# Patient Record
Sex: Male | Born: 1955
Health system: Southern US, Community
[De-identification: ages and names within clinical notes are randomized; demographics above are authoritative.]

## PROBLEM LIST (undated history)

## (undated) DIAGNOSIS — E669 Obesity, unspecified: Secondary | ICD-10-CM

## (undated) DIAGNOSIS — K769 Liver disease, unspecified: Secondary | ICD-10-CM

## (undated) DIAGNOSIS — K648 Other hemorrhoids: Secondary | ICD-10-CM

## (undated) DIAGNOSIS — R0600 Dyspnea, unspecified: Secondary | ICD-10-CM

## (undated) DIAGNOSIS — F1010 Alcohol abuse, uncomplicated: Secondary | ICD-10-CM

## (undated) DIAGNOSIS — K5790 Diverticulosis of intestine, part unspecified, without perforation or abscess without bleeding: Secondary | ICD-10-CM

## (undated) DIAGNOSIS — K759 Inflammatory liver disease, unspecified: Secondary | ICD-10-CM

## (undated) DIAGNOSIS — E7800 Pure hypercholesterolemia, unspecified: Secondary | ICD-10-CM

## (undated) DIAGNOSIS — M1990 Unspecified osteoarthritis, unspecified site: Secondary | ICD-10-CM

## (undated) DIAGNOSIS — C61 Malignant neoplasm of prostate: Secondary | ICD-10-CM

## (undated) DIAGNOSIS — D126 Benign neoplasm of colon, unspecified: Secondary | ICD-10-CM

## (undated) DIAGNOSIS — E119 Type 2 diabetes mellitus without complications: Secondary | ICD-10-CM

## (undated) DIAGNOSIS — D709 Neutropenia, unspecified: Secondary | ICD-10-CM

## (undated) DIAGNOSIS — D638 Anemia in other chronic diseases classified elsewhere: Secondary | ICD-10-CM

## (undated) DIAGNOSIS — Z8739 Personal history of other diseases of the musculoskeletal system and connective tissue: Secondary | ICD-10-CM

## (undated) DIAGNOSIS — F1911 Other psychoactive substance abuse, in remission: Secondary | ICD-10-CM

## (undated) DIAGNOSIS — Z960 Presence of urogenital implants: Secondary | ICD-10-CM

## (undated) DIAGNOSIS — S83281A Other tear of lateral meniscus, current injury, right knee, initial encounter: Secondary | ICD-10-CM

## (undated) DIAGNOSIS — I10 Essential (primary) hypertension: Secondary | ICD-10-CM

## (undated) HISTORY — DX: Personal history of other diseases of the musculoskeletal system and connective tissue: Z87.39

## (undated) HISTORY — DX: Pure hypercholesterolemia, unspecified: E78.00

## (undated) HISTORY — PX: MENISCUS REPAIR: SHX5179

## (undated) HISTORY — PX: LIVER BIOPSY: SHX301

## (undated) HISTORY — PX: KNEE ARTHROSCOPY: SHX127

---

## 2004-12-10 ENCOUNTER — Ambulatory Visit: Payer: Self-pay | Admitting: Family Medicine

## 2006-07-18 ENCOUNTER — Ambulatory Visit: Payer: Self-pay | Admitting: Family Medicine

## 2006-12-12 ENCOUNTER — Ambulatory Visit: Payer: Self-pay | Admitting: Gastroenterology

## 2007-01-25 ENCOUNTER — Ambulatory Visit: Payer: Self-pay | Admitting: Family Medicine

## 2008-05-06 ENCOUNTER — Ambulatory Visit: Payer: Self-pay | Admitting: Family Medicine

## 2009-09-05 ENCOUNTER — Ambulatory Visit: Payer: Self-pay | Admitting: Gastroenterology

## 2009-12-12 ENCOUNTER — Ambulatory Visit: Payer: Self-pay | Admitting: Gastroenterology

## 2014-04-09 ENCOUNTER — Ambulatory Visit: Payer: Self-pay | Admitting: Unknown Physician Specialty

## 2014-05-24 ENCOUNTER — Ambulatory Visit: Payer: Self-pay | Admitting: Unknown Physician Specialty

## 2015-01-02 ENCOUNTER — Encounter: Payer: Self-pay | Admitting: *Deleted

## 2015-01-03 ENCOUNTER — Encounter
Admission: RE | Disposition: A | Discharge status: Home / Self Care | Payer: Self-pay | Source: Ambulatory Visit | Attending: Gastroenterology

## 2015-01-03 ENCOUNTER — Ambulatory Visit
Admission: RE | Admit: 2015-01-03 | Discharge: 2015-01-03 | Disposition: A | Discharge status: Home / Self Care | Payer: PRIVATE HEALTH INSURANCE | Source: Ambulatory Visit | Attending: Gastroenterology | Admitting: Gastroenterology

## 2015-01-03 ENCOUNTER — Ambulatory Visit: Payer: PRIVATE HEALTH INSURANCE | Admitting: *Deleted

## 2015-01-03 ENCOUNTER — Encounter: Payer: Self-pay | Admitting: *Deleted

## 2015-01-03 DIAGNOSIS — K621 Rectal polyp: Secondary | ICD-10-CM | POA: Insufficient documentation

## 2015-01-03 DIAGNOSIS — Z86010 Personal history of colonic polyps: Secondary | ICD-10-CM | POA: Insufficient documentation

## 2015-01-03 DIAGNOSIS — D122 Benign neoplasm of ascending colon: Secondary | ICD-10-CM | POA: Insufficient documentation

## 2015-01-03 DIAGNOSIS — D120 Benign neoplasm of cecum: Secondary | ICD-10-CM | POA: Insufficient documentation

## 2015-01-03 DIAGNOSIS — Z8371 Family history of colonic polyps: Secondary | ICD-10-CM | POA: Diagnosis not present

## 2015-01-03 DIAGNOSIS — K759 Inflammatory liver disease, unspecified: Secondary | ICD-10-CM | POA: Diagnosis not present

## 2015-01-03 DIAGNOSIS — I10 Essential (primary) hypertension: Secondary | ICD-10-CM | POA: Diagnosis not present

## 2015-01-03 DIAGNOSIS — K648 Other hemorrhoids: Secondary | ICD-10-CM | POA: Diagnosis not present

## 2015-01-03 HISTORY — DX: Alcohol abuse, uncomplicated: F10.10

## 2015-01-03 HISTORY — DX: Other psychoactive substance abuse, in remission: F19.11

## 2015-01-03 HISTORY — PX: COLONOSCOPY WITH PROPOFOL: SHX5780

## 2015-01-03 HISTORY — DX: Inflammatory liver disease, unspecified: K75.9

## 2015-01-03 HISTORY — DX: Essential (primary) hypertension: I10

## 2015-01-03 HISTORY — DX: Liver disease, unspecified: K76.9

## 2015-01-03 HISTORY — DX: Diverticulosis of intestine, part unspecified, without perforation or abscess without bleeding: K57.90

## 2015-01-03 LAB — CBC
HCT: 45.4 % (ref 40.0–52.0)
Hemoglobin: 15.5 g/dL (ref 13.0–18.0)
MCH: 31.6 pg (ref 26.0–34.0)
MCHC: 34.2 g/dL (ref 32.0–36.0)
MCV: 92.4 fL (ref 80.0–100.0)
Platelets: 233 10*3/uL (ref 150–440)
RBC: 4.92 MIL/uL (ref 4.40–5.90)
RDW: 13.2 % (ref 11.5–14.5)
WBC: 3.4 10*3/uL — ABNORMAL LOW (ref 3.8–10.6)

## 2015-01-03 LAB — PROTIME-INR
INR: 1.08
Prothrombin Time: 14.2 seconds (ref 11.4–15.0)

## 2015-01-03 SURGERY — COLONOSCOPY WITH PROPOFOL
Anesthesia: General

## 2015-01-03 MED ORDER — SODIUM CHLORIDE 0.9 % IR SOLN
Status: DC | PRN
  Administered 2015-01-03: 8 mL

## 2015-01-03 MED ORDER — PROPOFOL INFUSION 10 MG/ML OPTIME
INTRAVENOUS | Status: DC | PRN
  Administered 2015-01-03: 70 mL via INTRAVENOUS

## 2015-01-03 MED ORDER — EPHEDRINE SULFATE 50 MG/ML IJ SOLN
INTRAMUSCULAR | Status: DC | PRN
  Administered 2015-01-03 (×2): 10 mg via INTRAVENOUS

## 2015-01-03 MED ORDER — SODIUM CHLORIDE 0.9 % IV SOLN
INTRAVENOUS | Status: DC
  Administered 2015-01-03 (×2): via INTRAVENOUS

## 2015-01-03 NOTE — Transfer of Care (Signed)
Immediate Anesthesia Transfer of Care Note  Patient: William Duffy  Procedure(s) Performed: Procedure(s): COLONOSCOPY WITH PROPOFOL (N/A)  Patient Location: PACU  Anesthesia Type:MAC  Level of Consciousness: awake  Airway & Oxygen Therapy: Patient Spontanous Breathing  Post-op Assessment: Report given to RN  Post vital signs: Reviewed  Last Vitals:  Filed Vitals:   01/03/15 1022  BP: 134/96  Pulse: 55  Temp: 35.6 C  Resp: 20    Complications: No apparent anesthesia complications

## 2015-01-03 NOTE — Anesthesia Postprocedure Evaluation (Signed)
  Anesthesia Post-op Note  Patient: William Duffy  Procedure(s) Performed: Procedure(s): COLONOSCOPY WITH PROPOFOL (N/A)  Anesthesia type:General  Patient location: PACU  Post pain: Pain level controlled  Post assessment: Post-op Vital signs reviewed, Patient's Cardiovascular Status Stable, Respiratory Function Stable, Patent Airway and No signs of Nausea or vomiting  Post vital signs: Reviewed and stable  Last Vitals:  Filed Vitals:   01/03/15 1246  BP: 138/82  Pulse: 85  Temp:   Resp: 12    Level of consciousness: awake, alert  and patient cooperative  Complications: No apparent anesthesia complications

## 2015-01-03 NOTE — Op Note (Signed)
[facility] Gastroenterology Patient Name: William Duffy Procedure Date: 01/03/2015 11:17 AM MRN: [id number] Account #: [id number] Date of Birth: 06-Apr-1956 Admit Type: Outpatient Age: 59 Room: [facility] ENDO ROOM 1 Gender: Male Note Status: Finalized Procedure:         Colonoscopy Indications:       Personal history of colonic polyps Providers:         Lollie Sails, MD Referring MD:      Dion Body (Referring MD) Medicines:         Monitored Anesthesia Care Complications:     No immediate complications. Procedure:         Pre-Anesthesia Assessment:                    - ASA Grade Assessment: II - A patient with mild systemic                     disease.                    After obtaining informed consent, the colonoscope was                     passed under direct vision. Throughout the procedure, the                     patient's blood pressure, pulse, and oxygen saturations                     were monitored continuously. The Olympus PCF-H180AL                     colonoscope ( S#: [id number] ) was introduced through the                     anus and advanced to the the cecum, identified by                     appendiceal orifice and ileocecal valve. The quality of                     the bowel preparation was good. Findings:      A 2 mm polyp was found in the cecum. The polyp was flat. The polyp was       removed with a cold biopsy forceps. Resection and retrieval were       complete.      A 35 mm polyp was found in the proximal ascending colon. The polyp was       multi-lobulated and sessile. The polyp was removed with a cold biopsy       forceps, The polyp was removed with a hot snare, The polyp was removed       with a saline injection-lift technique using a hot snare and The polyp       was removed with a piecemeal technique using a hot snare. Resection and       retrieval were complete. Two hemostatic clips were successfully placed.      The  digital rectal exam was normal.      A 3 mm polyp was found in the rectum. The polyp was sessile. The polyp       was removed with a hot snare. The polyp was removed with a lift and cut       technique using a hot snare. Resection  and retrieval were complete. Impression:        - One 2 mm polyp in the cecum. Resected and retrieved.                    - One 35 mm polyp in the proximal ascending colon.                     Resected and retrieved. Clips were placed.                    - One 3 mm polyp in the rectum. Resected and retrieved. Recommendation:    - Await pathology results.                    - Telephone GI clinic for pathology results in 1 week. Procedure Code(s): --- Professional ---                    [phone], 65, Colonoscopy, flexible; with endoscopic mucosal                     resection                    [id number], Colonoscopy, flexible; with removal of tumor(s),                     polyp(s), or other lesion(s) by snare technique                    45380, 44, Colonoscopy, flexible; with biopsy, single or                     multiple Diagnosis Code(s): --- Professional ---                    211.3, Benign neoplasm of colon                    569.0, Anal and rectal polyp                    V12.72, Personal history of colonic polyps CPT copyright 2014 American Medical Association. All rights reserved. The codes documented in this report are preliminary and upon coder review may  be revised to meet current compliance requirements. Lollie Sails, MD 01/03/2015 12:43:33 PM This report has been signed electronically. Number of Addenda: 0 Note Initiated On: 01/03/2015 11:17 AM Scope Withdrawal Time: 0 hours 36 minutes 17 seconds  Total Procedure Duration: 0 hours 44 minutes 51 seconds       [facility]

## 2015-01-03 NOTE — H&P (Signed)
Outpatient short stay form Pre-procedure 01/03/2015 11:42 AM Lollie Sails MD  Primary Physician: Dr Barbaraann Cao  Reason for visit:  Colonoscopy  History of present illness:  Patient is a 59 year old male presenting today for a colonoscopy. He has a personal history of adenomatous colon polyps. A family history of colon polyps in father. She does not take any anticoagulates or aspirin products.    Current facility-administered medications:  .  0.9 %  sodium chloride infusion, , Intravenous, Continuous, Lollie Sails, MD, Last Rate: 20 mL/hr at 01/03/15 1046  No prescriptions prior to admission     Allergies  Allergen Reactions  . Shrimp [Shellfish Allergy] Swelling     Past Medical History  Diagnosis Date  . Hepatitis   . H/O drug abuse   . Alcohol abuse   . Hypertension   . Liver disease   . Diverticulosis     Review of systems:      Physical Exam    Heart and lungs: Regular rate and rhythm without rub or gallop lungs are bilaterally clear    HEENT: Normocephalic atraumatic eyes are anicteric    Other:     Pertinant exam for procedure: Soft nontender nondistended bowel sounds positive normoactive    Planned proceedures: Anoscopy and indicated procedures I have discussed the risks benefits and complications of procedures to include not limited to bleeding, infection, perforation and the risk of sedation and the patient wishes to proceed.    Lollie Sails, MD Gastroenterology 01/03/2015  11:42 AM

## 2015-01-03 NOTE — Anesthesia Preprocedure Evaluation (Addendum)
Anesthesia Evaluation  Patient identified by MRN, date of birth, ID band Patient awake    Reviewed: Allergy & Precautions, NPO status , Patient's Chart, lab work & pertinent test results  Airway Mallampati: II       Dental  (+) Partial Upper   Pulmonary neg pulmonary ROS,  breath sounds clear to auscultation  Pulmonary exam normal       Cardiovascular hypertension, Normal cardiovascular exam    Neuro/Psych negative neurological ROS  negative psych ROS   GI/Hepatic (+) Hepatitis -, UnspecifiedLiver DZ diverticulosis   Endo/Other  negative endocrine ROS  Renal/GU negative Renal ROS  negative genitourinary   Musculoskeletal negative musculoskeletal ROS (+)   Abdominal Normal abdominal exam  (+)   Peds negative pediatric ROS (+)  Hematology negative hematology ROS (+)   Anesthesia Other Findings   Reproductive/Obstetrics                            Anesthesia Physical Anesthesia Plan  ASA: III  Anesthesia Plan: General   Post-op Pain Management:    Induction: Intravenous  Airway Management Planned: Nasal Cannula  Additional Equipment:   Intra-op Plan:   Post-operative Plan:   Informed Consent: I have reviewed the patients History and Physical, chart, labs and discussed the procedure including the risks, benefits and alternatives for the proposed anesthesia with the patient or authorized representative who has indicated his/her understanding and acceptance.   Dental advisory given  Plan Discussed with: CRNA and Surgeon  Anesthesia Plan Comments:         Anesthesia Quick Evaluation

## 2015-01-04 NOTE — Progress Notes (Signed)
Non-Identifying Voicemail.  No message left.

## 2015-01-07 ENCOUNTER — Encounter: Payer: Self-pay | Admitting: Gastroenterology

## 2015-01-07 LAB — SURGICAL PATHOLOGY

## 2015-05-06 ENCOUNTER — Ambulatory Visit
Admission: RE | Admit: 2015-05-06 | Discharge: 2015-05-06 | Disposition: A | Discharge status: Home / Self Care | Payer: PRIVATE HEALTH INSURANCE | Source: Ambulatory Visit | Attending: Gastroenterology | Admitting: Gastroenterology

## 2015-05-06 ENCOUNTER — Ambulatory Visit: Payer: PRIVATE HEALTH INSURANCE | Admitting: Anesthesiology

## 2015-05-06 ENCOUNTER — Encounter: Payer: Self-pay | Admitting: Anesthesiology

## 2015-05-06 ENCOUNTER — Encounter
Admission: RE | Disposition: A | Discharge status: Home / Self Care | Payer: Self-pay | Source: Ambulatory Visit | Attending: Gastroenterology

## 2015-05-06 DIAGNOSIS — M1990 Unspecified osteoarthritis, unspecified site: Secondary | ICD-10-CM | POA: Insufficient documentation

## 2015-05-06 DIAGNOSIS — K621 Rectal polyp: Secondary | ICD-10-CM | POA: Insufficient documentation

## 2015-05-06 DIAGNOSIS — K769 Liver disease, unspecified: Secondary | ICD-10-CM | POA: Diagnosis not present

## 2015-05-06 DIAGNOSIS — D120 Benign neoplasm of cecum: Secondary | ICD-10-CM | POA: Insufficient documentation

## 2015-05-06 DIAGNOSIS — Z91013 Allergy to seafood: Secondary | ICD-10-CM | POA: Diagnosis not present

## 2015-05-06 DIAGNOSIS — I10 Essential (primary) hypertension: Secondary | ICD-10-CM | POA: Diagnosis not present

## 2015-05-06 DIAGNOSIS — F1010 Alcohol abuse, uncomplicated: Secondary | ICD-10-CM | POA: Insufficient documentation

## 2015-05-06 DIAGNOSIS — Z86010 Personal history of colonic polyps: Secondary | ICD-10-CM | POA: Diagnosis present

## 2015-05-06 HISTORY — PX: COLONOSCOPY WITH PROPOFOL: SHX5780

## 2015-05-06 LAB — PROTIME-INR
INR: 0.98
PROTHROMBIN TIME: 13.2 s (ref 11.4–15.0)

## 2015-05-06 SURGERY — COLONOSCOPY WITH PROPOFOL
Anesthesia: General

## 2015-05-06 MED ORDER — FENTANYL CITRATE (PF) 100 MCG/2ML IJ SOLN
INTRAMUSCULAR | Status: DC | PRN
  Administered 2015-05-06: 50 ug via INTRAVENOUS

## 2015-05-06 MED ORDER — PROPOFOL 500 MG/50ML IV EMUL
INTRAVENOUS | Status: DC | PRN
  Administered 2015-05-06: 120 ug/kg/min via INTRAVENOUS

## 2015-05-06 MED ORDER — EPHEDRINE SULFATE 50 MG/ML IJ SOLN
INTRAMUSCULAR | Status: DC | PRN
  Administered 2015-05-06: 10 mg via INTRAVENOUS
  Administered 2015-05-06: 5 mg via INTRAVENOUS
  Administered 2015-05-06: 50 mg via INTRAVENOUS

## 2015-05-06 MED ORDER — SODIUM CHLORIDE 0.9 % IV SOLN
INTRAVENOUS | Status: DC
  Administered 2015-05-06: 08:00:00 via INTRAVENOUS

## 2015-05-06 MED ORDER — MIDAZOLAM HCL 2 MG/2ML IJ SOLN
INTRAMUSCULAR | Status: DC | PRN
  Administered 2015-05-06: 1 mg via INTRAVENOUS

## 2015-05-06 MED ORDER — PHENYLEPHRINE HCL 10 MG/ML IJ SOLN
INTRAMUSCULAR | Status: DC | PRN
  Administered 2015-05-06: 50 ug via INTRAVENOUS

## 2015-05-06 MED ORDER — SODIUM CHLORIDE 0.9 % IV SOLN: INTRAVENOUS | Status: DC

## 2015-05-06 NOTE — Op Note (Addendum)
[facility] Gastroenterology Patient Name: William Duffy Procedure Date: 05/06/2015 8:43 AM MRN: [id number] Account #: [id number] Date of Birth: 1956/02/23 Admit Type: Outpatient Age: 59 Room: [facility] ENDO ROOM 3 Gender: Male Note Status: Finalized Procedure:         Colonoscopy Indications:       Personal history of colonic polyps, , krecheck o21f site of                     previous polypectomy with high grade dysplasia Providers:         Lollie Sails, MD Referring MD:      Dion Body (Referring MD) Medicines:         Monitored Anesthesia Care Complications:     No immediate complications. Procedure:         Pre-Anesthesia Assessment:                    - ASA Grade Assessment: III - A patient with severe                     systemic disease.                    After obtaining informed consent, the colonoscope was                     passed under direct vision. Throughout the procedure, the                     patient's blood pressure, pulse, and oxygen saturations                     were monitored continuously. The Colonoscope was                     introduced through the anus and advanced to the the cecum,                     identified by appendiceal orifice and ileocecal valve. The                     colonoscopy was performed without difficulty. The patient                     tolerated the procedure well. The quality of the bowel                     preparation was good. Findings:      A less than 1 mm polyp was found in the cecum. The polyp was sessile.       The polyp was removed with a cold biopsy forceps. Resection and       retrieval were complete.      Post-polypectomy scar with some prominant mucosa folds consistant with       previous removal as noted in the proximal ascending colon. Biopsies were       taken with a cold forceps for histology. Grossly normal in appearance.      A 1 mm polyp was found in the rectum. The polyp was  sessile. The polyp       was removed with a cold biopsy forceps. Resection and retrieval were       complete.      The retroflexed view of the distal rectum and anal verge was normal and  showed no anal or rectal abnormalities. Impression:        - One less than 1 mm polyp in the cecum. Resected and                     retrieved.                    - Post-polypectomy scar in the proximal ascending colon.                     Biopsied.                    - One 1 mm polyp in the rectum. Resected and retrieved.                    - The distal rectum and anal verge are normal on                     retroflexion view. Recommendation:    - Discharge patient to home.                    - Telephone GI clinic for pathology results in 1 week. Procedure Code(s): --- Professional ---                    [phone], Colonoscopy, flexible; with biopsy, single or                     multiple Diagnosis Code(s): --- Professional ---                    211.3, Benign neoplasm of colon                    569.0, Anal and rectal polyp                    V12.72, Personal history of colonic polyps                    V45.89, Other postprocedural status CPT copyright 2014 American Medical Association. All rights reserved. The codes documented in this report are preliminary and upon coder review may  be revised to meet current compliance requirements. Lollie Sails, MD 05/06/2015 9:18:32 AM This report has been signed electronically. Number of Addenda: 0 Note Initiated On: 05/06/2015 8:43 AM Scope Withdrawal Time: 0 hours 16 minutes 7 seconds  Total Procedure Duration: 0 hours 20 minutes 14 seconds       [facility]

## 2015-05-06 NOTE — Transfer of Care (Signed)
Immediate Anesthesia Transfer of Care Note  Patient: William Duffy  Procedure(s) Performed: Procedure(s): COLONOSCOPY WITH PROPOFOL (N/A)  Patient Location: PACU  Anesthesia Type:General  Level of Consciousness: awake and sedated  Airway & Oxygen Therapy: Patient Spontanous Breathing and Patient connected to nasal cannula oxygen  Post-op Assessment: Report given to RN and Post -op Vital signs reviewed and stable  Post vital signs: Reviewed and stable  Last Vitals:  Filed Vitals:   05/06/15 0730  BP: 137/91  Pulse: 66  Temp: 36.3 C  Resp: 18    Complications: No apparent anesthesia complications

## 2015-05-06 NOTE — H&P (Signed)
Outpatient short stay form Pre-procedure 05/06/2015 8:08 AM William Sails MD  Primary Physician: Dr Dion Body  Reason for visit:  Colonoscopy  History of present illness:  Asian is a 59 year old male presenting for a recheck of a polypectomy site from colonoscopy done 01/03/2015. There is a 35 mm adenoma in the proximal ascending colon that was removed showing fragments of high-grade dysplasia. Her going to recheck the site sure that the removal was complete.  He tolerated his prep well. He takes no aspirin products. PTT and platelet count is been checked this morning before the procedure in reguards to some of his previous medical history.    Current facility-administered medications:  .  0.9 %  sodium chloride infusion, , Intravenous, Continuous, William Sails, MD .  0.9 %  sodium chloride infusion, , Intravenous, Continuous, William Sails, MD .  0.9 %  sodium chloride infusion, , Intravenous, Continuous, William Sails, MD, Last Rate: 20 mL/hr at 05/06/15 6759  Prescriptions prior to admission  Medication Sig Dispense Refill Last Dose  . acetaminophen (TYLENOL ARTHRITIS PAIN) 650 MG CR tablet Place 650 mg rectally every 8 (eight) hours as needed for pain.   05/05/2015     Allergies  Allergen Reactions  . Shrimp [Shellfish Allergy] Swelling     Past Medical History  Diagnosis Date  . Hepatitis   . H/O drug abuse   . Alcohol abuse   . Hypertension   . Liver disease   . Diverticulosis     Review of systems:      Physical Exam    Heart and lungs: Regular rate and rhythm without rub or gallop lungs are bilaterally clear    HEENT: Normocephalic atraumatic eyes are anicteric    Other:     Pertinant exam for procedure: Soft nontender nondistended bowel sounds positive normoactive    Planned proceedures: Colonoscopy I have discussed the risks benefits and complications of procedures to include not limited to bleeding, infection, perforation and  the risk of sedation and the patient wishes to proceed. and indicated procedures.    William Sails, MD Gastroenterology 05/06/2015  8:08 AM

## 2015-05-06 NOTE — Anesthesia Preprocedure Evaluation (Signed)
Anesthesia Evaluation  Patient identified by MRN, date of birth, ID band Patient awake    Reviewed: Allergy & Precautions, H&P , NPO status , Patient's Chart, lab work & pertinent test results  History of Anesthesia Complications Negative for: history of anesthetic complications  Airway Mallampati: II  TM Distance: >3 FB Neck ROM: full    Dental  (+) Partial Upper, Poor Dentition, Chipped, Missing   Pulmonary neg pulmonary ROS,    Pulmonary exam normal breath sounds clear to auscultation       Cardiovascular Exercise Tolerance: Good hypertension, (-) angina(-) DOE Normal cardiovascular exam Rhythm:regular Rate:Normal     Neuro/Psych negative neurological ROS  negative psych ROS   GI/Hepatic negative GI ROS, Neg liver ROS, neg GERD  ,(+) Hepatitis -, UnspecifiedLiver DZ diverticulosis   Endo/Other  negative endocrine ROS  Renal/GU negative Renal ROS  negative genitourinary   Musculoskeletal negative musculoskeletal ROS (+)   Abdominal Normal abdominal exam  (+)   Peds negative pediatric ROS (+)  Hematology negative hematology ROS (+)   Anesthesia Other Findings Past Medical History:   Hepatitis                                                    H/O drug abuse                                               Alcohol abuse                                                Hypertension                                                 Liver disease                                                Diverticulosis                                              Past Surgical History:   KNEE ARTHROSCOPY                                Right              COLONOSCOPY WITH PROPOFOL                       N/A 01/03/2015       Comment:Procedure: COLONOSCOPY WITH PROPOFOL;  Surgeon:              Lollie Sails, MD;  Location: [facility]  ENDOSCOPY;  Service: Endoscopy;  Laterality:               N/A;     Reproductive/Obstetrics negative OB ROS                             Anesthesia Physical Anesthesia Plan  ASA: III  Anesthesia Plan: General   Post-op Pain Management:    Induction:   Airway Management Planned:   Additional Equipment:   Intra-op Plan:   Post-operative Plan:   Informed Consent: I have reviewed the patients History and Physical, chart, labs and discussed the procedure including the risks, benefits and alternatives for the proposed anesthesia with the patient or authorized representative who has indicated his/her understanding and acceptance.   Dental Advisory Given  Plan Discussed with: Anesthesiologist, CRNA and Surgeon  Anesthesia Plan Comments:         Anesthesia Quick Evaluation

## 2015-05-06 NOTE — Anesthesia Postprocedure Evaluation (Signed)
  Anesthesia Post-op Note  Patient: William Duffy  Procedure(s) Performed: Procedure(s): COLONOSCOPY WITH PROPOFOL (N/A)  Anesthesia type:General  Patient location: PACU  Post pain: Pain level controlled  Post assessment: Post-op Vital signs reviewed, Patient's Cardiovascular Status Stable, Respiratory Function Stable, Patent Airway and No signs of Nausea or vomiting  Post vital signs: Reviewed and stable  Last Vitals:  Filed Vitals:   05/06/15 0935  BP: 131/99  Pulse:   Temp:   Resp:     Level of consciousness: awake, alert  and patient cooperative  Complications: No apparent anesthesia complications

## 2015-05-06 NOTE — Anesthesia Procedure Notes (Signed)
Performed by: COOK-MARTIN, Daziya Redmond Pre-anesthesia Checklist: Patient identified, Emergency Drugs available, Suction available, Patient being monitored and Timeout performed Patient Re-evaluated:Patient Re-evaluated prior to inductionOxygen Delivery Method: Nasal cannula Preoxygenation: Pre-oxygenation with 100% oxygen Intubation Type: IV induction Airway Equipment and Method: Bite block Placement Confirmation: CO2 detector and positive ETCO2     

## 2015-05-07 ENCOUNTER — Encounter: Payer: Self-pay | Admitting: Gastroenterology

## 2015-05-07 LAB — SURGICAL PATHOLOGY

## 2015-05-19 ENCOUNTER — Other Ambulatory Visit: Payer: Self-pay | Admitting: Gastroenterology

## 2015-05-19 DIAGNOSIS — B182 Chronic viral hepatitis C: Secondary | ICD-10-CM

## 2015-05-27 ENCOUNTER — Ambulatory Visit
Admission: RE | Admit: 2015-05-27 | Discharge: 2015-05-27 | Disposition: A | Discharge status: Home / Self Care | Payer: PRIVATE HEALTH INSURANCE | Source: Ambulatory Visit | Attending: Gastroenterology | Admitting: Gastroenterology

## 2015-05-27 DIAGNOSIS — K760 Fatty (change of) liver, not elsewhere classified: Secondary | ICD-10-CM | POA: Diagnosis not present

## 2015-05-27 DIAGNOSIS — B182 Chronic viral hepatitis C: Secondary | ICD-10-CM | POA: Insufficient documentation

## 2015-09-10 DIAGNOSIS — Z8679 Personal history of other diseases of the circulatory system: Secondary | ICD-10-CM | POA: Insufficient documentation

## 2015-09-15 DIAGNOSIS — R7303 Prediabetes: Secondary | ICD-10-CM | POA: Insufficient documentation

## 2015-10-29 ENCOUNTER — Other Ambulatory Visit: Payer: Self-pay | Admitting: Gastroenterology

## 2015-10-29 DIAGNOSIS — B182 Chronic viral hepatitis C: Secondary | ICD-10-CM

## 2015-11-04 ENCOUNTER — Ambulatory Visit
Admission: RE | Admit: 2015-11-04 | Discharge: 2015-11-04 | Disposition: A | Discharge status: Home / Self Care | Payer: PRIVATE HEALTH INSURANCE | Source: Ambulatory Visit | Attending: Gastroenterology | Admitting: Gastroenterology

## 2015-11-04 DIAGNOSIS — K760 Fatty (change of) liver, not elsewhere classified: Secondary | ICD-10-CM | POA: Diagnosis not present

## 2015-11-04 DIAGNOSIS — B182 Chronic viral hepatitis C: Secondary | ICD-10-CM | POA: Insufficient documentation

## 2015-12-07 ENCOUNTER — Emergency Department
Admission: EM | Admit: 2015-12-07 | Discharge: 2015-12-07 | Disposition: A | Discharge status: Home / Self Care | Payer: PRIVATE HEALTH INSURANCE | Attending: Emergency Medicine | Admitting: Emergency Medicine

## 2015-12-07 ENCOUNTER — Emergency Department: Payer: PRIVATE HEALTH INSURANCE

## 2015-12-07 DIAGNOSIS — M109 Gout, unspecified: Secondary | ICD-10-CM | POA: Diagnosis not present

## 2015-12-07 DIAGNOSIS — M25562 Pain in left knee: Secondary | ICD-10-CM | POA: Diagnosis present

## 2015-12-07 DIAGNOSIS — I10 Essential (primary) hypertension: Secondary | ICD-10-CM | POA: Diagnosis not present

## 2015-12-07 LAB — CBC WITH DIFFERENTIAL/PLATELET
BASOS ABS: 0.1 10*3/uL (ref 0–0.1)
EOS ABS: 0 10*3/uL (ref 0–0.7)
Eosinophils Relative: 0 %
HEMATOCRIT: 42.4 % (ref 40.0–52.0)
HEMOGLOBIN: 14.5 g/dL (ref 13.0–18.0)
Lymphocytes Relative: 7 %
Lymphs Abs: 0.5 10*3/uL — ABNORMAL LOW (ref 1.0–3.6)
MCH: 31.2 pg (ref 26.0–34.0)
MCHC: 34.2 g/dL (ref 32.0–36.0)
MCV: 91.2 fL (ref 80.0–100.0)
Monocytes Absolute: 0.6 10*3/uL (ref 0.2–1.0)
NEUTROS ABS: 6.6 10*3/uL — AB (ref 1.4–6.5)
Platelets: 258 10*3/uL (ref 150–440)
RBC: 4.65 MIL/uL (ref 4.40–5.90)
RDW: 13 % (ref 11.5–14.5)
WBC: 7.9 10*3/uL (ref 3.8–10.6)

## 2015-12-07 LAB — URIC ACID: URIC ACID, SERUM: 8.4 mg/dL — AB (ref 4.4–7.6)

## 2015-12-07 MED ORDER — COLCHICINE 0.6 MG PO TABS: 0.6000 mg | ORAL_TABLET | Freq: Every day | ORAL | Status: DC

## 2015-12-07 MED ORDER — INDOMETHACIN 50 MG PO CAPS: 50.0000 mg | ORAL_CAPSULE | Freq: Three times a day (TID) | ORAL | Status: DC | PRN

## 2015-12-07 MED ORDER — IBUPROFEN 600 MG PO TABS
600.0000 mg | ORAL_TABLET | Freq: Once | ORAL | Status: DC
  Filled 2015-12-07: qty 1

## 2015-12-07 NOTE — ED Provider Notes (Signed)
[facility] Emergency Department Provider Note  ____________________________________________  Time seen: Approximately 10:29 AM  I have reviewed the triage vital signs and the nursing notes.   HISTORY  Chief Complaint Knee Pain    HPI William Duffy is a 60 y.o. male , NAD, presents to the emergency department with 3 day history of left knee pain and swelling. Has noted some redness to the knee over the last couple of days. Pain increases with ambulating and bending of the knee. Does not noted any locking or popping about the knee nor any weakness about the knee. Denies any injury, trauma, falls to cause the pain. Has not had any pain or swelling about his knee or other joint in the past. Has not noted any skin sores or open wounds about the area. Denies any numbness, weakness, tingling. Has not had any lower back pain. Denies any fevers, chills, body aches.   Past Medical History  Diagnosis Date  . Hepatitis   . H/O drug abuse   . Alcohol abuse   . Hypertension   . Liver disease   . Diverticulosis     There are no active problems to display for this patient.   Past Surgical History  Procedure Laterality Date  . Knee arthroscopy Right   . Colonoscopy with propofol N/A 01/03/2015    Procedure: COLONOSCOPY WITH PROPOFOL;  Surgeon: Lollie Sails, MD;  Location: [facility] ENDOSCOPY;  Service: Endoscopy;  Laterality: N/A;  . Colonoscopy with propofol N/A 05/06/2015    Procedure: COLONOSCOPY WITH PROPOFOL;  Surgeon: Lollie Sails, MD;  Location: [facility] ENDOSCOPY;  Service: Endoscopy;  Laterality: N/A;    Current Outpatient Rx  Name  Route  Sig  Dispense  Refill  . acetaminophen (TYLENOL ARTHRITIS PAIN) 650 MG CR tablet   Rectal   Place 650 mg rectally every 8 (eight) hours as needed for pain.         Marland Kitchen colchicine 0.6 MG tablet   Oral   Take 1 tablet (0.6 mg total) by mouth daily. Take 2 tablets at onset of pain, then may take 1 more 1 hour later if  pain continues.   6 tablet   0   . indomethacin (INDOCIN) 50 MG capsule   Oral   Take 1 capsule (50 mg total) by mouth 3 (three) times daily with meals as needed.   21 capsule   0     Allergies Shrimp  No family history on file.  Social History Social History  Substance Use Topics  . Smoking status: Never Smoker   . Smokeless tobacco: Never Used  . Alcohol Use: No     Review of Systems  Constitutional: No fever/chills, fatigue Cardiovascular: No chest pain. Respiratory: No cough. No shortness of breath. No wheezing.  Gastrointestinal: No abdominal pain.  No nausea, vomiting. Musculoskeletal: Positive left knee pain. Negative for back pain.  Skin: Positive redness, swelling left knee. Negative for rash, skin sores, open wounds. Neurological: Negative for headaches, focal weakness or numbness. No tingling.  10-point ROS otherwise negative.  ____________________________________________   PHYSICAL EXAM:  VITAL SIGNS: ED Triage Vitals  Enc Vitals Group     BP 12/07/15 1009 138/87 mmHg     Pulse Rate 12/07/15 1009 98     Resp 12/07/15 1009 18     Temp 12/07/15 1009 98.1 F (36.7 C)     Temp Source 12/07/15 1009 Oral     SpO2 12/07/15 1009 95 %     Weight  12/07/15 1009 228 lb (103.42 kg)     Height 12/07/15 1009 5\' 6"  (1.676 m)     Head Cir --      Peak Flow --      Pain Score 12/07/15 1011 10     Pain Loc --      Pain Edu? --      Excl. in [place]? --      Constitutional: Alert and oriented. Well appearing and in no acute distress. Eyes: Conjunctivae are normal.  Head: Atraumatic. Cardiovascular: Good peripheral circulation with 2+ pulses noted in the left lower extremity. Capillary refill is brisk in the left lower extremity. Respiratory: Normal respiratory effort without tachypnea or retractions.  Musculoskeletal: Positive patellofemoral grind pain. Tenderness to palpation over the anterior knee. No pain to palpation of the posterior knee, left calf for left  lower extremity. No lower extremity edema.  No joint effusions. Neurologic:  Normal speech and language. No gross focal neurologic deficits are appreciated. Sensation to light touch is grossly intact about the left lower extremity. Skin:  Skin about the anterior left knee is mildly erythematous, warm to palpation and mildly swollen. Skin is warm, dry and intact. No rash, open wounds, skin sores noted. Psychiatric: Mood and affect are normal. Speech and behavior are normal. Patient exhibits appropriate insight and judgement.   ____________________________________________   LABS (all labs ordered are listed, but only abnormal results are displayed)  Labs Reviewed  CBC WITH DIFFERENTIAL/PLATELET - Abnormal; Notable for the following:    Neutro Abs 6.6 (*)    Lymphs Abs 0.5 (*)    All other components within normal limits  URIC ACID - Abnormal; Notable for the following:    Uric Acid, Serum 8.4 (*)    All other components within normal limits   ____________________________________________  EKG  None ____________________________________________  RADIOLOGY I have personally viewed and evaluated these images (plain radiographs) as part of my medical decision making, as well as reviewing the written report by the radiologist.  Dg Knee Complete 4 Views Left  12/07/2015  CLINICAL DATA:  60 year old male with left knee pain and swelling for 3 days. No known injury. Initial encounter. EXAM: LEFT KNEE - COMPLETE 4+ VIEW COMPARISON:  None. FINDINGS: There is no evidence fracture, subluxation or dislocation. Mild joint space narrowing and osteophytosis in the patellofemoral compartment noted. There is a suggestion of lateral compartment chondrocalcinosis. No joint effusion or focal bony lesions are noted. IMPRESSION: No evidence of acute abnormality. Mild degenerative changes in the patellofemoral compartment. Possible lateral compartment chondrocalcinosis/ CPPD. Electronically Signed   By: Margarette Canada M.D.   On: 12/07/2015 10:50    ____________________________________________    PROCEDURES  Procedure(s) performed: None    Medications  ibuprofen (ADVIL,MOTRIN) tablet 600 mg (not administered)     ____________________________________________   INITIAL IMPRESSION / ASSESSMENT AND PLAN / ED COURSE  Pertinent labs & imaging results that were available during my care of the patient were reviewed by me and considered in my medical decision making (see chart for details).  Patient's diagnosis is consistent with acute gout of the left knee. Patient will be discharged home with prescriptions for colchicine and indomethacin to take as directed. Patient is to rest the left knee over the next 1-2 days to recover. Patient is to follow up with his primary care provider if symptoms persist past this treatment course as well as to have a repeat uric acid in 2 weeks to assess for chronic gout. Patient is given  ED precautions to return to the ED for any worsening or new symptoms.    ____________________________________________  FINAL CLINICAL IMPRESSION(S) / ED DIAGNOSES  Final diagnoses:  Acute gout of left knee, unspecified cause      NEW MEDICATIONS STARTED DURING THIS VISIT:  New Prescriptions   COLCHICINE 0.6 MG TABLET    Take 1 tablet (0.6 mg total) by mouth daily. Take 2 tablets at onset of pain, then may take 1 more 1 hour later if pain continues.   INDOMETHACIN (INDOCIN) 50 MG CAPSULE    Take 1 capsule (50 mg total) by mouth 3 (three) times daily with meals as needed.         Braxton Feathers, PA-C 12/07/15 [place], MD 12/07/15 [phone]

## 2015-12-07 NOTE — ED Notes (Signed)
See triage note  States left knee pain w/o known injury  States pain increases with ambulation   Some swelling noted on arrival

## 2015-12-07 NOTE — ED Notes (Signed)
Pt reports to left knee that started on Thursday. Denies injury. Walking made pain worse. Minor swelling per pt no redness

## 2015-12-07 NOTE — Discharge Instructions (Signed)

## 2015-12-17 ENCOUNTER — Other Ambulatory Visit: Payer: Self-pay | Admitting: Gastroenterology

## 2015-12-17 DIAGNOSIS — B182 Chronic viral hepatitis C: Secondary | ICD-10-CM

## 2016-06-16 ENCOUNTER — Other Ambulatory Visit: Payer: Self-pay | Admitting: Gastroenterology

## 2016-06-16 DIAGNOSIS — B182 Chronic viral hepatitis C: Secondary | ICD-10-CM

## 2016-06-17 ENCOUNTER — Ambulatory Visit
Admission: RE | Admit: 2016-06-17 | Discharge: 2016-06-17 | Disposition: A | Discharge status: Home / Self Care | Payer: 59 | Source: Ambulatory Visit | Attending: Gastroenterology | Admitting: Gastroenterology

## 2016-06-17 DIAGNOSIS — B182 Chronic viral hepatitis C: Secondary | ICD-10-CM | POA: Diagnosis not present

## 2016-06-21 ENCOUNTER — Other Ambulatory Visit: Payer: Self-pay | Admitting: Gastroenterology

## 2016-06-21 DIAGNOSIS — B182 Chronic viral hepatitis C: Secondary | ICD-10-CM

## 2016-07-07 ENCOUNTER — Ambulatory Visit: Payer: PRIVATE HEALTH INSURANCE

## 2016-11-02 DIAGNOSIS — D708 Other neutropenia: Secondary | ICD-10-CM | POA: Insufficient documentation

## 2016-11-04 ENCOUNTER — Inpatient Hospital Stay: Payer: 59 | Attending: Oncology | Admitting: Oncology

## 2016-11-04 ENCOUNTER — Inpatient Hospital Stay: Payer: 59

## 2016-11-04 ENCOUNTER — Encounter: Payer: Self-pay | Admitting: Oncology

## 2016-11-04 VITALS — BP 128/85 | HR 71 | Temp 97.4°F | Resp 18 | Wt 228.1 lb

## 2016-11-04 DIAGNOSIS — Z8619 Personal history of other infectious and parasitic diseases: Secondary | ICD-10-CM | POA: Diagnosis not present

## 2016-11-04 DIAGNOSIS — E7800 Pure hypercholesterolemia, unspecified: Secondary | ICD-10-CM | POA: Insufficient documentation

## 2016-11-04 DIAGNOSIS — Z79899 Other long term (current) drug therapy: Secondary | ICD-10-CM

## 2016-11-04 DIAGNOSIS — M1990 Unspecified osteoarthritis, unspecified site: Secondary | ICD-10-CM | POA: Insufficient documentation

## 2016-11-04 DIAGNOSIS — I10 Essential (primary) hypertension: Secondary | ICD-10-CM | POA: Insufficient documentation

## 2016-11-04 DIAGNOSIS — D709 Neutropenia, unspecified: Secondary | ICD-10-CM | POA: Diagnosis not present

## 2016-11-04 LAB — CBC WITH DIFFERENTIAL/PLATELET
Basophils Absolute: 0 10*3/uL (ref 0–0.1)
Basophils Relative: 1 %
EOS PCT: 4 %
Eosinophils Absolute: 0.1 10*3/uL (ref 0–0.7)
HCT: 42.3 % (ref 40.0–52.0)
HEMOGLOBIN: 14.8 g/dL (ref 13.0–18.0)
LYMPHS ABS: 1.5 10*3/uL (ref 1.0–3.6)
LYMPHS PCT: 40 %
MCH: 31.7 pg (ref 26.0–34.0)
MCHC: 35 g/dL (ref 32.0–36.0)
MCV: 90.6 fL (ref 80.0–100.0)
Monocytes Absolute: 0.3 10*3/uL (ref 0.2–1.0)
Monocytes Relative: 9 %
Neutro Abs: 1.8 10*3/uL (ref 1.4–6.5)
Neutrophils Relative %: 48 %
PLATELETS: 285 10*3/uL (ref 150–440)
RBC: 4.67 MIL/uL (ref 4.40–5.90)
RDW: 12.8 % (ref 11.5–14.5)
WBC: 3.7 10*3/uL — AB (ref 3.8–10.6)

## 2016-11-04 LAB — SAVE SMEAR

## 2016-11-04 LAB — VITAMIN B12: Vitamin B-12: 282 pg/mL (ref 180–914)

## 2016-11-04 LAB — FOLATE: Folate: 20.3 ng/mL (ref >5.9)

## 2016-11-04 NOTE — Progress Notes (Signed)
On recent physical by PCP patient was found to have low WBC.  He did take Harvoni for Hep C treatment last year and not sure low WBC could be medication related.

## 2016-11-04 NOTE — Progress Notes (Signed)
Hematology/Oncology Consult note [facility] [phone] [phone]  Patient Care Team: Dion Body, MD as PCP - General (Family Medicine)   Name of the patient: William Duffy  [id number]  Apr 23, 1956    Reason for referral- leukopenia/neutropenia   Referring physician- Dr. Netty Starring  Date of visit: 11/04/16   History of presenting illness- patient is a 61 year old African-American male with a past medical history significant for hepatitis C for which she has been treated with hormone E in the past. He has been referred to [place] for evaluation of leukopenia/neutropenia. Patient has had chronic neutropenia at least since 2016 and his white count ranges between 2.73.7. Most recent CBC from 10/26/2016 showed white count of 3, H&H of 14.4/41.2 and platelet count of 259. Differential on the neutrophil reveals mild to moderate neutropenia with an absolute neutrophil count ranging between 818- 200. I do not have any prior CBCs before 2016 for comparison. Overall patient is doing well and reports no significant complaints. He denies significant fatigue, loss of appetite or unintentional weight loss. Denies any lumps or bumps anywhere. Denies any recurrent infections. No personal history of cancer family history of any blood disorders.  ECOG PS- 0  Pain scale- 0   Review of systems- Review of Systems  Constitutional: Negative for chills, fever, malaise/fatigue and weight loss.  HENT: Negative for congestion, ear discharge and nosebleeds.   Eyes: Negative for blurred vision.  Respiratory: Negative for cough, hemoptysis, sputum production, shortness of breath and wheezing.   Cardiovascular: Negative for chest pain, palpitations, orthopnea and claudication.  Gastrointestinal: Negative for abdominal pain, blood in stool, constipation, diarrhea, heartburn, melena, nausea and vomiting.  Genitourinary: Negative for dysuria, flank pain, frequency,  hematuria and urgency.  Musculoskeletal: Negative for back pain, joint pain and myalgias.  Skin: Negative for rash.  Neurological: Negative for dizziness, tingling, focal weakness, seizures, weakness and headaches.  Endo/Heme/Allergies: Does not bruise/bleed easily.  Psychiatric/Behavioral: Negative for depression and suicidal ideas. The patient does not have insomnia.     Allergies  Allergen Reactions  . Shrimp [Shellfish Allergy] Swelling    There are no active problems to display for this patient.    Past Medical History:  Diagnosis Date  . Alcohol abuse   . Diverticulosis   . H/O drug abuse   . H/O: gout   . Hepatitis   . Hypercholesteremia   . Hypertension   . Liver disease      Past Surgical History:  Procedure Laterality Date  . COLONOSCOPY WITH PROPOFOL N/A 01/03/2015   Procedure: COLONOSCOPY WITH PROPOFOL;  Surgeon: Lollie Sails, MD;  Location[facility] ENDOSCOPY;  Service: Endoscopy;  Laterality: N/A;  . COLONOSCOPY WITH PROPOFOL N/A 05/06/2015   Procedure: COLONOSCOPY WITH PROPOFOL;  Surgeon: Lollie Sails, MD;  Location: [facility] ENDOSCOPY;  Service: Endoscopy;  Laterality: N/A;  . KNEE ARTHROSCOPY Right     Social History   Social History  . Marital status: Married    Spouse name: N/A  . Number of children: N/A  . Years of education: N/A   Occupational History  . Not on file.   Social History Main Topics  . Smoking status: Never Smoker  . Smokeless tobacco: Never Used  . Alcohol use No  . Drug use: No  . Sexual activity: Not on file   Other Topics Concern  . Not on file   Social History Narrative  . No narrative on file     No family history on file.  Current Outpatient Prescriptions:  .  acetaminophen (TYLENOL ARTHRITIS PAIN) 650 MG CR tablet, Place 650 mg rectally every 8 (eight) hours as needed for pain., Disp: , Rfl:  .  colchicine 0.6 MG tablet, Take 1 tablet (0.6 mg total) by mouth daily. Take 2 tablets at onset of pain, then may  take 1 more 1 hour later if pain continues. (Patient not taking: Reported on 11/04/2016), Disp: 6 tablet, Rfl: 0 .  indomethacin (INDOCIN) 50 MG capsule, Take 1 capsule (50 mg total) by mouth 3 (three) times daily with meals as needed. (Patient not taking: Reported on 11/04/2016), Disp: 21 capsule, Rfl: 0   Physical exam:  Vitals:   11/04/16 1118  BP: 128/85  Pulse: 71  Resp: 18  Temp: 97.4 F (36.3 C)  TempSrc: Tympanic  Weight: 228 lb 1.6 oz (103.5 kg)   Physical Exam  Constitutional: He is oriented to person, place, and time and well-developed, well-nourished, and in no distress.  HENT:  Head: Normocephalic and atraumatic.  Eyes: EOM are normal. Pupils are equal, round, and reactive to light.  Neck: Normal range of motion.  Cardiovascular: Normal rate, regular rhythm and normal heart sounds.   Pulmonary/Chest: Effort normal and breath sounds normal.  Abdominal: Soft. Bowel sounds are normal.  Neurological: He is alert and oriented to person, place, and time.  Skin: Skin is warm and dry.       No flowsheet data found. CBC Latest Ref Rng & Units 11/04/2016  WBC 3.8 - 10.6 K/uL 3.7(L)  Hemoglobin 13.0 - 18.0 g/dL 14.8  Hematocrit 40.0 - 52.0 % 42.3  Platelets 150 - 440 K/uL 285     Assessment and plan- Patient is a 61 y.o. male referred to [place] for evaluation and management of leukopenia/neutropenia  Patient has mild leukopenia/neutropenia dating back 2016. He probably has long-standing neutropenia and I will try to obtain his prior CBCs before 2016. This could be benign and make neutropenia given his ethnicity given that he has isolated neutropenia and the absence of other cytopenias and absence of recurrent infections. Today I will check a CBC with differential get a pathologic review of his smear check for HIV B12 and folate testing. The results of the above blood work are unremarkable and we'll see him back in 6 months time. If his counts remain stable over the next 6 months  patient didn't continue to follow-up with his primary care doctor for his leukopenia and can be referred to [place] in the future should his neutropenia worsen or if he were to develop any other cytopenias or B symptoms   Thank you for this kind referral and the opportunity to participate in the care of this patient   Visit Diagnosis 1. Neutropenia, unspecified type [facility])     Dr. Randa Evens, MD, MPH [facility] at [facility] Pager- [phone] 11/04/2016  2:50 PM

## 2016-11-05 LAB — HIV ANTIBODY (ROUTINE TESTING W REFLEX): HIV Screen 4th Generation wRfx: NONREACTIVE

## 2017-02-08 ENCOUNTER — Other Ambulatory Visit: Payer: Self-pay | Admitting: Gastroenterology

## 2017-02-08 DIAGNOSIS — Z8619 Personal history of other infectious and parasitic diseases: Secondary | ICD-10-CM

## 2017-02-14 ENCOUNTER — Ambulatory Visit
Admission: RE | Admit: 2017-02-14 | Discharge: 2017-02-14 | Disposition: A | Discharge status: Home / Self Care | Payer: 59 | Source: Ambulatory Visit | Attending: Gastroenterology | Admitting: Gastroenterology

## 2017-02-14 DIAGNOSIS — K760 Fatty (change of) liver, not elsewhere classified: Secondary | ICD-10-CM | POA: Insufficient documentation

## 2017-02-14 DIAGNOSIS — Z8619 Personal history of other infectious and parasitic diseases: Secondary | ICD-10-CM | POA: Diagnosis present

## 2017-04-01 ENCOUNTER — Other Ambulatory Visit: Payer: Self-pay | Admitting: Gastroenterology

## 2017-04-01 DIAGNOSIS — B1920 Unspecified viral hepatitis C without hepatic coma: Secondary | ICD-10-CM

## 2017-04-18 ENCOUNTER — Ambulatory Visit: Payer: 59

## 2017-04-25 ENCOUNTER — Telehealth: Payer: Self-pay | Admitting: Oncology

## 2017-04-25 NOTE — Telephone Encounter (Signed)
Lab/MD appts rschd from 05/06/17 to 05/10/17, per provider request/PAL. Unable to reach patient. No V/M available. Updated appointment schedule mailed. MF

## 2017-05-06 ENCOUNTER — Ambulatory Visit: Payer: 59 | Admitting: Oncology

## 2017-05-06 ENCOUNTER — Other Ambulatory Visit: Payer: 59

## 2017-05-10 ENCOUNTER — Encounter: Payer: Self-pay | Admitting: Oncology

## 2017-05-10 ENCOUNTER — Inpatient Hospital Stay: Payer: 59

## 2017-05-10 ENCOUNTER — Inpatient Hospital Stay: Payer: 59 | Attending: Oncology | Admitting: Oncology

## 2017-05-10 VITALS — BP 142/90 | HR 61 | Temp 97.4°F | Resp 14 | Wt 233.0 lb

## 2017-05-10 DIAGNOSIS — I10 Essential (primary) hypertension: Secondary | ICD-10-CM

## 2017-05-10 DIAGNOSIS — Z8619 Personal history of other infectious and parasitic diseases: Secondary | ICD-10-CM | POA: Diagnosis not present

## 2017-05-10 DIAGNOSIS — D709 Neutropenia, unspecified: Secondary | ICD-10-CM | POA: Diagnosis not present

## 2017-05-10 DIAGNOSIS — E7800 Pure hypercholesterolemia, unspecified: Secondary | ICD-10-CM | POA: Insufficient documentation

## 2017-05-10 DIAGNOSIS — Z79899 Other long term (current) drug therapy: Secondary | ICD-10-CM

## 2017-05-10 LAB — CBC WITH DIFFERENTIAL/PLATELET
BASOS ABS: 0 10*3/uL (ref 0–0.1)
Basophils Relative: 1 %
EOS PCT: 4 %
Eosinophils Absolute: 0.1 10*3/uL (ref 0–0.7)
HEMATOCRIT: 42.3 % (ref 40.0–52.0)
Hemoglobin: 14.4 g/dL (ref 13.0–18.0)
LYMPHS PCT: 47 %
Lymphs Abs: 1.2 10*3/uL (ref 1.0–3.6)
MCH: 31.1 pg (ref 26.0–34.0)
MCHC: 34 g/dL (ref 32.0–36.0)
MCV: 91.3 fL (ref 80.0–100.0)
MONO ABS: 0.3 10*3/uL (ref 0.2–1.0)
MONOS PCT: 10 %
NEUTROS ABS: 1 10*3/uL — AB (ref 1.4–6.5)
Neutrophils Relative %: 38 %
PLATELETS: 283 10*3/uL (ref 150–440)
RBC: 4.63 MIL/uL (ref 4.40–5.90)
RDW: 12.9 % (ref 11.5–14.5)
WBC: 2.6 10*3/uL — ABNORMAL LOW (ref 3.8–10.6)

## 2017-05-10 LAB — PATHOLOGIST SMEAR REVIEW

## 2017-05-10 NOTE — Progress Notes (Signed)
Patient here for follow up with labs today. He states that he is feeling well and denies having any pain. 

## 2017-05-10 NOTE — Progress Notes (Signed)
Hematology/Oncology Consult note [facility]  [phone] [phone]  Patient Care Team: Dion Body, MD as PCP - General (Family Medicine)   Name of the patient: William Duffy  [id number]  August 16, 1955   Date of visit: 05/10/17  Diagnosis- neutropenia likely benign  Chief complaint/ Reason for visit- routine f/u of neutropenia  Heme/Onc history: patient is a 61 year old African-American male with a past medical history significant for hepatitis C for which she has been treated with harvoni in the past. He has been referred to [place] for evaluation of leukopenia/neutropenia. Patient has had chronic neutropenia at least since 2016 and his white count ranges between 2.73.7. Most recent CBC from 10/26/2016 showed white count of 3, H&H of 14.4/41.2 and platelet count of 259. Differential on the neutrophil reveals mild to moderate neutropenia with an absolute neutrophil count ranging between 818- 200. I do not have any prior CBCs before 2016 for comparison. Overall patient is doing well and reports no significant complaints. He denies significant fatigue, loss of appetite or unintentional weight loss. Denies any lumps or bumps anywhere. Denies any recurrent infections. No personal history of cancer family history of any blood disorders.  Results of blood work from 11/04/2016 were as follows: CBC showed white count of 3.7, H&H of 14.8/42.3 and a platelet count of 285.  B12 and folate were within normal limits and HIV antibody testing was negative.  Trend of his cbc is as follows: Component     Latest Ref Rng & Units 01/03/2015 12/07/2015 11/04/2016 05/10/2017  WBC     3.8 - 10.6 K/uL 3.4 (L) 7.9 3.7 (L) 2.6 (L)  RBC     4.40 - 5.90 MIL/uL 4.92 4.65 4.67 4.63  Hemoglobin     13.0 - 18.0 g/dL 15.5 14.5 14.8 14.4  HCT     40.0 - 52.0 % 45.4 42.4 42.3 42.3  MCV     80.0 - 100.0 fL 92.4 91.2 90.6 91.3  MCH     26.0 - 34.0 pg 31.6 31.2 31.7 31.1   MCHC     32.0 - 36.0 g/dL 34.2 34.2 35.0 34.0  RDW     11.5 - 14.5 % 13.2 13.0 12.8 12.9  Platelets     150 - 440 K/uL 233 258 285 283  Neutrophils     %  84% 48 38  NEUT#     1.4 - 6.5 K/uL  6.6 (H) 1.8 1.0 (L)  Lymphocytes     %  7% 40 47  Lymphocyte #     1.0 - 3.6 K/uL  0.5 (L) 1.5 1.2  Monocytes Relative     %  8% 9 10  Monocyte #     0.2 - 1.0 K/uL  0.6 0.3 0.3  Eosinophil     %  0% 4 4  Eosinophils Absolute     0 - 0.7 K/uL  0.0 0.1 0.1  Basophil     %  1% 1 1  Basophils Absolute     0 - 0.1 K/uL  0.1 0.0 0.0      Interval history- patient feels well. dneis any recurrent infections, illness. Denies any unintentional weight loss  ECOG PS- 0 Pain scale- 0  Review of systems- Review of Systems  Constitutional: Negative for chills, fever, malaise/fatigue and weight loss.  HENT: Negative for congestion, ear discharge and nosebleeds.   Eyes: Negative for blurred vision.  Respiratory: Negative for cough, hemoptysis, sputum production, shortness of breath  and wheezing.   Cardiovascular: Negative for chest pain, palpitations, orthopnea and claudication.  Gastrointestinal: Negative for abdominal pain, blood in stool, constipation, diarrhea, heartburn, melena, nausea and vomiting.  Genitourinary: Negative for dysuria, flank pain, frequency, hematuria and urgency.  Musculoskeletal: Negative for back pain, joint pain and myalgias.  Skin: Negative for rash.  Neurological: Negative for dizziness, tingling, focal weakness, seizures, weakness and headaches.  Endo/Heme/Allergies: Does not bruise/bleed easily.  Psychiatric/Behavioral: Negative for depression and suicidal ideas. The patient does not have insomnia.       Allergies  Allergen Reactions  . Shrimp [Shellfish Allergy] Swelling     Past Medical History:  Diagnosis Date  . Alcohol abuse   . Diverticulosis   . H/O drug abuse   . H/O: gout   . Hepatitis   . Hypercholesteremia   . Hypertension   . Liver  disease      Past Surgical History:  Procedure Laterality Date  . KNEE ARTHROSCOPY Right     Social History   Socioeconomic History  . Marital status: Widowed    Spouse name: Not on file  . Number of children: Not on file  . Years of education: Not on file  . Highest education level: Not on file  Social Needs  . Financial resource strain: Not on file  . Food insecurity - worry: Not on file  . Food insecurity - inability: Not on file  . Transportation needs - medical: Not on file  . Transportation needs - non-medical: Not on file  Occupational History  . Not on file  Tobacco Use  . Smoking status: Never Smoker  . Smokeless tobacco: Never Used  Substance and Sexual Activity  . Alcohol use: No  . Drug use: No  . Sexual activity: Not on file  Other Topics Concern  . Not on file  Social History Narrative  . Not on file    No family history on file.   Current Outpatient Medications:  .  acetaminophen (TYLENOL ARTHRITIS PAIN) 650 MG CR tablet, Place 650 mg rectally every 8 (eight) hours as needed for pain., Disp: , Rfl:  .  colchicine 0.6 MG tablet, Take 1 tablet (0.6 mg total) by mouth daily. Take 2 tablets at onset of pain, then may take 1 more 1 hour later if pain continues. (Patient not taking: Reported on 11/04/2016), Disp: 6 tablet, Rfl: 0 .  indomethacin (INDOCIN) 50 MG capsule, Take 1 capsule (50 mg total) by mouth 3 (three) times daily with meals as needed. (Patient not taking: Reported on 11/04/2016), Disp: 21 capsule, Rfl: 0  Physical exam:  Vitals:   05/10/17 1042 05/10/17 1046  BP:  (!) 142/90  Pulse:  61  Resp:  14  Temp:  (!) 97.4 F (36.3 C)  TempSrc: Tympanic Tympanic  Weight: 233 lb (105.7 kg)    Physical Exam  Constitutional: He is oriented to person, place, and time and well-developed, well-nourished, and in no distress.  HENT:  Head: Normocephalic and atraumatic.  Mouth/Throat: Oropharynx is clear and moist.  Eyes: EOM are normal. Pupils are  equal, round, and reactive to light.  Neck: Normal range of motion.  Cardiovascular: Normal rate, regular rhythm and normal heart sounds.  Pulmonary/Chest: Effort normal and breath sounds normal.  Abdominal: Soft. Bowel sounds are normal.  Lymphadenopathy:  No palpable cervical, supraclavicular, inguinal adenopathy  Neurological: He is alert and oriented to person, place, and time.  Skin: Skin is warm and dry.     No flowsheet data  found. CBC Latest Ref Rng & Units 05/10/2017  WBC 3.8 - 10.6 K/uL 2.6(L)  Hemoglobin 13.0 - 18.0 g/dL 14.4  Hematocrit 40.0 - 52.0 % 42.3  Platelets 150 - 440 K/uL 283      Assessment and plan- Patient is a 61 y.o. male referred for neutropenia  Prior counts before 2016 not available for comparison. His neutropenia is worse over last 1 year. Unclear if it waxes and wanes in the interim. I will repeat cbc with diff in 6 weeks and 12 weeks and see him back in 12 weeks given downward trend in his neutropenia. He has isolated neutropenia in the absence of anemia or thrombocytopenia. Will check copper. Rheumatoid factor, ANA in 6 weeks. Path review of smear today  If wbc is lower in 6 weeks, I will proceed to bone marrow biopsy. Patient is currently on colchine which he takes occasionally and can cause neutrpenia very rarely. Losartan started recently ad not known to cause neutropenia. Discussed risks and benefits of bone marrow biopsy including all but not limited to pain, bleeding. Patient understands and agrees to proceed if need be   Visit Diagnosis 1. Neutropenia, unspecified type [facility])      Dr. Randa Evens, MD, MPH [facility] at [facility] Pager- [phone] 05/10/2017 11:26 AM

## 2017-06-06 ENCOUNTER — Encounter: Payer: Self-pay | Admitting: *Deleted

## 2017-06-06 ENCOUNTER — Ambulatory Visit: Payer: BLUE CROSS/BLUE SHIELD | Admitting: Anesthesiology

## 2017-06-06 ENCOUNTER — Encounter
Admission: RE | Disposition: A | Discharge status: Home / Self Care | Payer: Self-pay | Source: Ambulatory Visit | Attending: Gastroenterology

## 2017-06-06 ENCOUNTER — Ambulatory Visit
Admission: RE | Admit: 2017-06-06 | Discharge: 2017-06-06 | Disposition: A | Discharge status: Home / Self Care | Payer: BLUE CROSS/BLUE SHIELD | Source: Ambulatory Visit | Attending: Gastroenterology | Admitting: Gastroenterology

## 2017-06-06 DIAGNOSIS — I10 Essential (primary) hypertension: Secondary | ICD-10-CM | POA: Insufficient documentation

## 2017-06-06 DIAGNOSIS — Z1211 Encounter for screening for malignant neoplasm of colon: Secondary | ICD-10-CM | POA: Insufficient documentation

## 2017-06-06 DIAGNOSIS — E7800 Pure hypercholesterolemia, unspecified: Secondary | ICD-10-CM | POA: Diagnosis not present

## 2017-06-06 DIAGNOSIS — Z8371 Family history of colonic polyps: Secondary | ICD-10-CM | POA: Insufficient documentation

## 2017-06-06 DIAGNOSIS — E669 Obesity, unspecified: Secondary | ICD-10-CM | POA: Diagnosis not present

## 2017-06-06 DIAGNOSIS — Z79899 Other long term (current) drug therapy: Secondary | ICD-10-CM | POA: Insufficient documentation

## 2017-06-06 DIAGNOSIS — Z6836 Body mass index (BMI) 36.0-36.9, adult: Secondary | ICD-10-CM | POA: Diagnosis not present

## 2017-06-06 DIAGNOSIS — B1920 Unspecified viral hepatitis C without hepatic coma: Secondary | ICD-10-CM | POA: Insufficient documentation

## 2017-06-06 DIAGNOSIS — Z91013 Allergy to seafood: Secondary | ICD-10-CM | POA: Diagnosis not present

## 2017-06-06 DIAGNOSIS — F419 Anxiety disorder, unspecified: Secondary | ICD-10-CM | POA: Insufficient documentation

## 2017-06-06 DIAGNOSIS — Z86010 Personal history of colonic polyps: Secondary | ICD-10-CM | POA: Diagnosis not present

## 2017-06-06 HISTORY — PX: COLONOSCOPY WITH PROPOFOL: SHX5780

## 2017-06-06 HISTORY — DX: Benign neoplasm of colon, unspecified: D12.6

## 2017-06-06 HISTORY — DX: Other tear of lateral meniscus, current injury, right knee, initial encounter: S83.281A

## 2017-06-06 HISTORY — DX: Other hemorrhoids: K64.8

## 2017-06-06 HISTORY — DX: Neutropenia, unspecified: D70.9

## 2017-06-06 HISTORY — DX: Obesity, unspecified: E66.9

## 2017-06-06 LAB — CBC WITH DIFFERENTIAL/PLATELET
Basophils Absolute: 0 10*3/uL (ref 0–0.1)
Basophils Relative: 1 %
EOS ABS: 0.1 10*3/uL (ref 0–0.7)
Eosinophils Relative: 4 %
HCT: 46.8 % (ref 40.0–52.0)
HEMOGLOBIN: 15.7 g/dL (ref 13.0–18.0)
LYMPHS ABS: 1.3 10*3/uL (ref 1.0–3.6)
Lymphocytes Relative: 42 %
MCH: 31.1 pg (ref 26.0–34.0)
MCHC: 33.6 g/dL (ref 32.0–36.0)
MCV: 92.6 fL (ref 80.0–100.0)
MONO ABS: 0.3 10*3/uL (ref 0.2–1.0)
MONOS PCT: 11 %
Neutro Abs: 1.4 10*3/uL (ref 1.4–6.5)
Neutrophils Relative %: 42 %
Platelets: 291 10*3/uL (ref 150–440)
RBC: 5.05 MIL/uL (ref 4.40–5.90)
RDW: 12.9 % (ref 11.5–14.5)
WBC: 3.2 10*3/uL — ABNORMAL LOW (ref 3.8–10.6)

## 2017-06-06 LAB — PROTIME-INR
INR: 1.07
PROTHROMBIN TIME: 13.8 s (ref 11.4–15.2)

## 2017-06-06 SURGERY — COLONOSCOPY WITH PROPOFOL
Anesthesia: General

## 2017-06-06 MED ORDER — LIDOCAINE HCL (CARDIAC) 20 MG/ML IV SOLN
INTRAVENOUS | Status: DC | PRN
  Administered 2017-06-06: 100 mg via INTRAVENOUS

## 2017-06-06 MED ORDER — MIDAZOLAM HCL 2 MG/2ML IJ SOLN
INTRAMUSCULAR | Status: DC | PRN
  Administered 2017-06-06: 2 mg via INTRAVENOUS

## 2017-06-06 MED ORDER — MIDAZOLAM HCL 2 MG/2ML IJ SOLN
INTRAMUSCULAR | Status: AC
  Filled 2017-06-06: qty 2

## 2017-06-06 MED ORDER — SODIUM CHLORIDE 0.9 % IV SOLN
INTRAVENOUS | Status: DC
  Administered 2017-06-06 (×2): via INTRAVENOUS

## 2017-06-06 MED ORDER — FENTANYL CITRATE (PF) 100 MCG/2ML IJ SOLN
INTRAMUSCULAR | Status: AC
  Filled 2017-06-06: qty 2

## 2017-06-06 MED ORDER — PHENYLEPHRINE HCL 10 MG/ML IJ SOLN
INTRAMUSCULAR | Status: DC | PRN
Start: 2017-06-06 — End: 2017-06-06
  Administered 2017-06-06 (×2): 200 ug via INTRAVENOUS

## 2017-06-06 MED ORDER — PROPOFOL 500 MG/50ML IV EMUL
INTRAVENOUS | Status: AC
  Filled 2017-06-06: qty 50

## 2017-06-06 MED ORDER — PROPOFOL 500 MG/50ML IV EMUL
INTRAVENOUS | Status: DC | PRN
  Administered 2017-06-06: 150 ug/kg/min via INTRAVENOUS

## 2017-06-06 MED ORDER — SODIUM CHLORIDE 0.9 % IV SOLN: INTRAVENOUS | Status: DC

## 2017-06-06 MED ORDER — PROPOFOL 10 MG/ML IV BOLUS
INTRAVENOUS | Status: DC | PRN
  Administered 2017-06-06: 50 mg via INTRAVENOUS

## 2017-06-06 MED ORDER — GLYCOPYRROLATE 0.2 MG/ML IJ SOLN
INTRAMUSCULAR | Status: DC | PRN
  Administered 2017-06-06: 0.2 mg via INTRAVENOUS

## 2017-06-06 NOTE — Transfer of Care (Signed)
Immediate Anesthesia Transfer of Care Note  Patient: Priest Lockridge  Procedure(s) Performed: COLONOSCOPY WITH PROPOFOL (N/A )  Patient Location: Endoscopy Unit  Anesthesia Type:General  Level of Consciousness: drowsy and patient cooperative  Airway & Oxygen Therapy: Patient Spontanous Breathing and Patient connected to nasal cannula oxygen  Post-op Assessment: Report given to RN and Post -op Vital signs reviewed and stable  Post vital signs: Reviewed and stable  Last Vitals:  Vitals:   06/06/17 1247 06/06/17 1539  BP: (!) 124/93 (!) 92/58  Pulse: 82 77  Resp: 18 14  Temp: (!) 35.7 C (!) 36.2 C  SpO2: 97% 100%    Last Pain:  Vitals:   06/06/17 1539  TempSrc: Tympanic         Complications: No apparent anesthesia complications

## 2017-06-06 NOTE — Anesthesia Preprocedure Evaluation (Signed)
Anesthesia Evaluation  Patient identified by MRN, date of birth, ID band Patient awake    Reviewed: Allergy & Precautions, NPO status , Patient's Chart, lab work & pertinent test results, reviewed documented beta blocker date and time   Airway Mallampati: III  TM Distance: >3 FB     Dental  (+) Chipped, Partial Upper, Missing   Pulmonary           Cardiovascular hypertension, Pt. on medications      Neuro/Psych Anxiety    GI/Hepatic (+) Hepatitis -, C  Endo/Other    Renal/GU      Musculoskeletal   Abdominal   Peds  Hematology   Anesthesia Other Findings ETOH hx. Gout. Obese.  Reproductive/Obstetrics                             Anesthesia Physical Anesthesia Plan  ASA: III  Anesthesia Plan: General   Post-op Pain Management:    Induction: Intravenous  PONV Risk Score and Plan:   Airway Management Planned:   Additional Equipment:   Intra-op Plan:   Post-operative Plan:   Informed Consent: I have reviewed the patients History and Physical, chart, labs and discussed the procedure including the risks, benefits and alternatives for the proposed anesthesia with the patient or authorized representative who has indicated his/her understanding and acceptance.     Plan Discussed with: CRNA  Anesthesia Plan Comments:         Anesthesia Quick Evaluation

## 2017-06-06 NOTE — Anesthesia Post-op Follow-up Note (Signed)
Anesthesia QCDR form completed.        

## 2017-06-06 NOTE — Anesthesia Postprocedure Evaluation (Signed)
Anesthesia Post Note  Patient: William Duffy  Procedure(s) Performed: COLONOSCOPY WITH PROPOFOL (N/A )  Patient location during evaluation: Endoscopy Anesthesia Type: General Level of consciousness: awake and alert and oriented Pain management: pain level controlled Vital Signs Assessment: post-procedure vital signs reviewed and stable Respiratory status: spontaneous breathing, nonlabored ventilation and respiratory function stable Cardiovascular status: blood pressure returned to baseline and stable Postop Assessment: no signs of nausea or vomiting Anesthetic complications: no     Last Vitals:  Vitals:   06/06/17 1559 06/06/17 1609  BP: 139/85 118/86  Pulse: 91 84  Resp: 15 14  Temp:    SpO2: 100% 97%    Last Pain:  Vitals:   06/06/17 1539  TempSrc: Tympanic                 Liesl Simons

## 2017-06-06 NOTE — Op Note (Signed)
[facility] Gastroenterology Patient Name: William Duffy Procedure Date: 06/06/2017 3:08 PM MRN: [id number] Account #: [id number] Date of Birth: Nov 11, 1955 Admit Type: Outpatient Age: 61 Room: [facility] ENDO ROOM 3 Gender: Male Note Status: Finalized Procedure:            Colonoscopy Indications:          Family history of colonic polyps in a first-degree                        relative, Personal history of colonic polyps Providers:            Lollie Sails, MD Referring MD:         Dion Body (Referring MD) Medicines:            Monitored Anesthesia Care Complications:        No immediate complications. Procedure:            Pre-Anesthesia Assessment:                       - ASA Grade Assessment: III - A patient with severe                        systemic disease.                       After obtaining informed consent, the colonoscope was                        passed under direct vision. Throughout the procedure,                        the patient's blood pressure, pulse, and oxygen                        saturations were monitored continuously. The                        Colonoscope was introduced through the anus and                        advanced to the the cecum, identified by appendiceal                        orifice and ileocecal valve. The colonoscopy was                        performed without difficulty. The patient tolerated the                        procedure well. The quality of the bowel preparation                        was good. Findings:      The colon (entire examined portion) appeared normal.      The retroflexed view of the distal rectum and anal verge was normal and       showed no anal or rectal abnormalities.      The digital rectal exam was normal. Impression:           - The entire examined colon is normal.                       -  The distal rectum and anal verge are normal on                        retroflexion view.                     - No specimens collected. Recommendation:       - Discharge patient to home.                       - Repeat colonoscopy in 5 years for screening purposes. Procedure Code(s):    --- Professional ---                       [phone], Colonoscopy, flexible; diagnostic, including                        collection of specimen(s) by brushing or washing, when                        performed (separate procedure) Diagnosis Code(s):    --- Professional ---                       Z83.71, Family history of colonic polyps                       Z86.010, Personal history of colonic polyps CPT copyright 2016 American Medical Association. All rights reserved. The codes documented in this report are preliminary and upon coder review may  be revised to meet current compliance requirements. Lollie Sails, MD 06/06/2017 3:38:03 PM This report has been signed electronically. Number of Addenda: 0 Note Initiated On: 06/06/2017 3:08 PM Scope Withdrawal Time: 0 hours 9 minutes 9 seconds  Total Procedure Duration: 0 hours 15 minutes 6 seconds       [facility]

## 2017-06-06 NOTE — H&P (Signed)
Outpatient short stay form Pre-procedure 06/06/2017 3:10 PM William Sails MD  Primary Physician: Dr Dion Body  Reason for visit:  Colonoscopy  History of present illness:  Patient is a 61 year old male presenting today as above. He has personal history of adenomatous colon polyps and family history of polyps in both parents. He does have a history of neutropenia/leukopenia, this was checked today and adequate for procedure. Platelet count was normal and a ProTime was 13.8 INR 1.07. He tolerated his prep well. He takes no aspirin or blood thinning agents.    Current Facility-Administered Medications:  .  0.9 %  sodium chloride infusion, , Intravenous, Continuous, William Sails, MD, Last Rate: 20 mL/hr at 06/06/17 1315 .  0.9 %  sodium chloride infusion, , Intravenous, Continuous, William Sails, MD  Medications Prior to Admission  Medication Sig Dispense Refill Last Dose  . losartan (COZAAR) 25 MG tablet Take 25 mg daily by mouth.   06/05/2017 at Unknown time  . colchicine 0.6 MG tablet Take 1 tablet (0.6 mg total) by mouth daily. Take 2 tablets at onset of pain, then may take 1 more 1 hour later if pain continues. (Patient not taking: Reported on 06/03/2017) 6 tablet 0 Not Taking at Unknown time  . indomethacin (INDOCIN) 50 MG capsule Take 50 mg by mouth 3 (three) times daily with meals.   Completed Course at Unknown time     Allergies  Allergen Reactions  . Shrimp [Shellfish Allergy] Swelling     Past Medical History:  Diagnosis Date  . Acute lateral meniscus tear of right knee   . Alcohol abuse   . Colon adenomas   . Diverticulosis   . H/O drug abuse   . H/O: gout   . Hepatitis    hepatitis c  . Hypercholesteremia   . Hypertension   . Internal hemorrhoids   . Liver disease   . Neutropenia ([place])   . Obesity     Review of systems:      Physical Exam    Heart and lungs: Regular rate and rhythm without rub or gallop, lungs are bilaterally  clear.    HEENT: Normocephalic atraumatic eyes are anicteric    Other:     Pertinant exam for procedure: Soft nontender nondistended bowel sounds positive normoactive.    Planned proceedures: Colonoscopy and indicated procedures. I have discussed the risks benefits and complications of procedures to include not limited to bleeding, infection, perforation and the risk of sedation and the patient wishes to proceed.    William Sails, MD Gastroenterology 06/06/2017  3:10 PM

## 2017-06-07 ENCOUNTER — Encounter: Payer: Self-pay | Admitting: Gastroenterology

## 2017-06-21 ENCOUNTER — Inpatient Hospital Stay: Payer: 59 | Attending: Oncology

## 2017-06-21 DIAGNOSIS — D709 Neutropenia, unspecified: Secondary | ICD-10-CM | POA: Insufficient documentation

## 2017-06-21 LAB — CBC WITH DIFFERENTIAL/PLATELET
BASOS ABS: 0 10*3/uL (ref 0–0.1)
BASOS PCT: 1 %
EOS PCT: 5 %
Eosinophils Absolute: 0.1 10*3/uL (ref 0–0.7)
HEMATOCRIT: 42.4 % (ref 40.0–52.0)
HEMOGLOBIN: 14.4 g/dL (ref 13.0–18.0)
Lymphocytes Relative: 45 %
Lymphs Abs: 1.3 10*3/uL (ref 1.0–3.6)
MCH: 31.5 pg (ref 26.0–34.0)
MCHC: 33.9 g/dL (ref 32.0–36.0)
MCV: 92.7 fL (ref 80.0–100.0)
MONO ABS: 0.3 10*3/uL (ref 0.2–1.0)
Monocytes Relative: 11 %
Neutro Abs: 1.1 10*3/uL — ABNORMAL LOW (ref 1.4–6.5)
Neutrophils Relative %: 38 %
Platelets: 279 10*3/uL (ref 150–440)
RBC: 4.57 MIL/uL (ref 4.40–5.90)
RDW: 12.8 % (ref 11.5–14.5)
WBC: 2.8 10*3/uL — AB (ref 3.8–10.6)

## 2017-06-22 LAB — ANA COMPREHENSIVE PANEL
Anti JO-1: <0.2 AI (ref 0.0–0.9)
Centromere Ab Screen: <0.2 AI (ref 0.0–0.9)
DS DNA AB: 1 [IU]/mL (ref 0–9)
SSA (Ro) (ENA) Antibody, IgG: <0.2 AI (ref 0.0–0.9)
SSB (La) (ENA) Antibody, IgG: <0.2 AI (ref 0.0–0.9)

## 2017-06-23 LAB — RHEUMATOID ARTHRITIS PROFILE
CCP ANTIBODIES IGG/IGA: 5 U (ref 0–19)
Rhuematoid fact SerPl-aCnc: <10 IU/mL (ref 0.0–13.9)

## 2017-06-23 LAB — COPPER, SERUM: COPPER: 81 ug/dL (ref 72–166)

## 2017-08-02 ENCOUNTER — Other Ambulatory Visit: Payer: 59

## 2017-08-02 ENCOUNTER — Ambulatory Visit: Payer: 59 | Admitting: Oncology

## 2017-08-15 ENCOUNTER — Other Ambulatory Visit: Payer: Self-pay

## 2017-08-16 ENCOUNTER — Encounter: Payer: Self-pay | Admitting: Oncology

## 2017-08-16 ENCOUNTER — Inpatient Hospital Stay (HOSPITAL_BASED_OUTPATIENT_CLINIC_OR_DEPARTMENT_OTHER): Payer: BLUE CROSS/BLUE SHIELD | Admitting: Oncology

## 2017-08-16 ENCOUNTER — Inpatient Hospital Stay: Payer: BLUE CROSS/BLUE SHIELD | Attending: Oncology

## 2017-08-16 VITALS — BP 155/72 | HR 72 | Temp 97.9°F | Ht 65.0 in | Wt 234.0 lb

## 2017-08-16 DIAGNOSIS — E669 Obesity, unspecified: Secondary | ICD-10-CM | POA: Insufficient documentation

## 2017-08-16 DIAGNOSIS — D708 Other neutropenia: Secondary | ICD-10-CM | POA: Diagnosis not present

## 2017-08-16 DIAGNOSIS — I10 Essential (primary) hypertension: Secondary | ICD-10-CM | POA: Diagnosis not present

## 2017-08-16 DIAGNOSIS — D709 Neutropenia, unspecified: Secondary | ICD-10-CM

## 2017-08-16 LAB — CBC WITH DIFFERENTIAL/PLATELET
Basophils Absolute: 0 10*3/uL (ref 0–0.1)
Basophils Relative: 1 %
Eosinophils Absolute: 0.2 10*3/uL (ref 0–0.7)
Eosinophils Relative: 5 %
HEMATOCRIT: 43.4 % (ref 40.0–52.0)
HEMOGLOBIN: 14.6 g/dL (ref 13.0–18.0)
LYMPHS ABS: 1.5 10*3/uL (ref 1.0–3.6)
LYMPHS PCT: 32 %
MCH: 31 pg (ref 26.0–34.0)
MCHC: 33.5 g/dL (ref 32.0–36.0)
MCV: 92.3 fL (ref 80.0–100.0)
Monocytes Absolute: 0.5 10*3/uL (ref 0.2–1.0)
Monocytes Relative: 12 %
NEUTROS ABS: 2.3 10*3/uL (ref 1.4–6.5)
NEUTROS PCT: 50 %
Platelets: 285 10*3/uL (ref 150–440)
RBC: 4.71 MIL/uL (ref 4.40–5.90)
RDW: 12.7 % (ref 11.5–14.5)
WBC: 4.6 10*3/uL (ref 3.8–10.6)

## 2017-08-16 NOTE — Progress Notes (Signed)
No new change.

## 2017-08-18 NOTE — Progress Notes (Signed)
Hematology/Oncology Consult note [facility]  Telephone:([phone] Fax:([phone]  Patient Care Team: Dion Body, MD as PCP - General (Family Medicine)   Name of the patient: William Duffy  [id number]  Sep 27, 1955   Date of visit: 08/18/17  Diagnosis- neutropenia likely benign  Chief complaint/ Reason for visit- routine f/u of neutropenia  Heme/Onc history: patient is a 62 year old African-American male with a past medical history significant for hepatitis C for which she has been treated with harvoni in the past. He has been referred to [place] for evaluation of leukopenia/neutropenia. Patient has had chronic neutropenia at least since 2016 and his white count ranges between 2.73.7. Most recent CBC from 10/26/2016 showed white count of 3, H&H of 14.4/41.2 and platelet count of 259. Differential on the neutrophil reveals mild to moderate neutropenia with an absolute neutrophil count ranging between 818- 200.I do not have any prior CBCs before 2016 for comparison. Overall patient is doing well and reports no significant complaints. He denies significant fatigue, loss of appetite or unintentional weight loss. Denies any lumps or bumps anywhere. Denies any recurrent infections. No personal history of cancer family history of any blood disorders.  Results of blood work from 11/04/2016 were as follows: CBC showed white count of 3.7, H&H of 14.8/42.3 and a platelet count of 285.  B12 and folate were within normal limits and HIV antibody testing was negative.  WBC waxes and wanes between 2.5-3.5 with predominant neutropenia that has been asymptomatic    Interval history-patient is doing well.  Denies any symptoms of fatigue, unintentional weight loss, recurrent hospitalizations or infections.  ECOG PS- 0 Pain scale- 0   Review of systems- Review of Systems  Constitutional: Negative for chills, fever, malaise/fatigue and weight loss.  HENT: Negative for  congestion, ear discharge and nosebleeds.   Eyes: Negative for blurred vision.  Respiratory: Negative for cough, hemoptysis, sputum production, shortness of breath and wheezing.   Cardiovascular: Negative for chest pain, palpitations, orthopnea and claudication.  Gastrointestinal: Negative for abdominal pain, blood in stool, constipation, diarrhea, heartburn, melena, nausea and vomiting.  Genitourinary: Negative for dysuria, flank pain, frequency, hematuria and urgency.  Musculoskeletal: Negative for back pain, joint pain and myalgias.  Skin: Negative for rash.  Neurological: Negative for dizziness, tingling, focal weakness, seizures, weakness and headaches.  Endo/Heme/Allergies: Does not bruise/bleed easily.  Psychiatric/Behavioral: Negative for depression and suicidal ideas. The patient does not have insomnia.      Current treatment-observation  Allergies  Allergen Reactions  . Shrimp [Shellfish Allergy] Swelling     Past Medical History:  Diagnosis Date  . Acute lateral meniscus tear of right knee   . Alcohol abuse   . Colon adenomas   . Diverticulosis   . H/O drug abuse   . H/O: gout   . Hepatitis    hepatitis c  . Hypercholesteremia   . Hypertension   . Internal hemorrhoids   . Liver disease   . Neutropenia ([place])   . Obesity      Past Surgical History:  Procedure Laterality Date  . COLONOSCOPY WITH PROPOFOL N/A 01/03/2015   Procedure: COLONOSCOPY WITH PROPOFOL;  Surgeon: Lollie Sails, MD;  Location: [facility] ENDOSCOPY;  Service: Endoscopy;  Laterality: N/A;  . COLONOSCOPY WITH PROPOFOL N/A 05/06/2015   Procedure: COLONOSCOPY WITH PROPOFOL;  Surgeon: Lollie Sails, MD;  Location: [facility] ENDOSCOPY;  Service: Endoscopy;  Laterality: N/A;  . COLONOSCOPY WITH PROPOFOL N/A 06/06/2017   Procedure: COLONOSCOPY WITH PROPOFOL;  Surgeon:  Lollie Sails, MD;  Location: [facility] ENDOSCOPY;  Service: Endoscopy;  Laterality: N/A;  . KNEE ARTHROSCOPY Right   . LIVER BIOPSY    .  MENISCUS REPAIR      Social History   Socioeconomic History  . Marital status: Widowed    Spouse name: Not on file  . Number of children: Not on file  . Years of education: Not on file  . Highest education level: Not on file  Social Needs  . Financial resource strain: Not on file  . Food insecurity - worry: Not on file  . Food insecurity - inability: Not on file  . Transportation needs - medical: Not on file  . Transportation needs - non-medical: Not on file  Occupational History  . Not on file  Tobacco Use  . Smoking status: Never Smoker  . Smokeless tobacco: Never Used  Substance and Sexual Activity  . Alcohol use: No  . Drug use: No  . Sexual activity: Not on file  Other Topics Concern  . Not on file  Social History Narrative  . Not on file    History reviewed. No pertinent family history.   Current Outpatient Medications:  .  losartan (COZAAR) 25 MG tablet, Take 25 mg daily by mouth., Disp: , Rfl:  .  colchicine 0.6 MG tablet, Take 1 tablet (0.6 mg total) by mouth daily. Take 2 tablets at onset of pain, then may take 1 more 1 hour later if pain continues. (Patient not taking: Reported on 06/03/2017), Disp: 6 tablet, Rfl: 0 .  indomethacin (INDOCIN) 50 MG capsule, Take 50 mg by mouth 3 (three) times daily with meals., Disp: , Rfl:   Physical exam:  Vitals:   08/16/17 1454  BP: (!) 155/72  Pulse: 72  Temp: 97.9 F (36.6 C)  TempSrc: Tympanic  SpO2: 95%  Weight: 234 lb (106.1 kg)  Height: 5' 5"  (1.651 m)   Physical Exam  Constitutional: He is oriented to person, place, and time and well-developed, well-nourished, and in no distress.  HENT:  Head: Normocephalic and atraumatic.  Eyes: EOM are normal. Pupils are equal, round, and reactive to light.  Neck: Normal range of motion.  Cardiovascular: Normal rate, regular rhythm and normal heart sounds.  Pulmonary/Chest: Effort normal and breath sounds normal.  Abdominal: Soft. Bowel sounds are normal.  No  splenomegaly  Lymphadenopathy:  No palpable adenopathy  Neurological: He is alert and oriented to person, place, and time.  Skin: Skin is warm and dry.     No flowsheet data found. CBC Latest Ref Rng & Units 08/16/2017  WBC 3.8 - 10.6 K/uL 4.6  Hemoglobin 13.0 - 18.0 g/dL 14.6  Hematocrit 40.0 - 52.0 % 43.4  Platelets 150 - 440 K/uL 285     Assessment and plan- Patient is a 62 y.o. male with likely benign ethnic neutropenia  Today patient's white count is 4.6 with an absolute neutrophil count of 2.3.  He has had waxing and waning WBC between 2.5 to normal counts with predominant neutropenia in the past.  He is asymptomatic and has no B symptoms or palpable splenomegaly or adenopathy.  This can be monitored conservatively without any further need for a bone marrow biopsy at this time.  Repeat CBC with differential in 6 months in 1 year and I will see him back in 1 year.  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 Other neutropenia ([place])      Dr. Randa Evens, MD, MPH [facility]  at [facility] Pager- [phone] 08/18/2017 10:39 AM

## 2017-08-19 ENCOUNTER — Ambulatory Visit
Admission: RE | Admit: 2017-08-19 | Discharge: 2017-08-19 | Disposition: A | Discharge status: Home / Self Care | Payer: BLUE CROSS/BLUE SHIELD | Source: Ambulatory Visit | Attending: Gastroenterology | Admitting: Gastroenterology

## 2017-08-19 DIAGNOSIS — B1920 Unspecified viral hepatitis C without hepatic coma: Secondary | ICD-10-CM | POA: Insufficient documentation

## 2018-02-13 ENCOUNTER — Other Ambulatory Visit: Payer: Self-pay

## 2018-02-13 ENCOUNTER — Inpatient Hospital Stay: Payer: BLUE CROSS/BLUE SHIELD | Attending: Oncology

## 2018-02-13 DIAGNOSIS — D708 Other neutropenia: Secondary | ICD-10-CM

## 2018-02-13 DIAGNOSIS — I10 Essential (primary) hypertension: Secondary | ICD-10-CM | POA: Diagnosis not present

## 2018-02-13 DIAGNOSIS — E669 Obesity, unspecified: Secondary | ICD-10-CM | POA: Diagnosis not present

## 2018-02-13 LAB — CBC WITH DIFFERENTIAL/PLATELET
BASOS ABS: 0 10*3/uL (ref 0–0.1)
BASOS PCT: 1 %
EOS ABS: 0.2 10*3/uL (ref 0–0.7)
EOS PCT: 4 %
HCT: 41.5 % (ref 40.0–52.0)
HEMOGLOBIN: 14.5 g/dL (ref 13.0–18.0)
Lymphocytes Relative: 43 %
Lymphs Abs: 1.9 10*3/uL (ref 1.0–3.6)
MCH: 32 pg (ref 26.0–34.0)
MCHC: 35 g/dL (ref 32.0–36.0)
MCV: 91.4 fL (ref 80.0–100.0)
Monocytes Absolute: 0.4 10*3/uL (ref 0.2–1.0)
Monocytes Relative: 9 %
NEUTROS PCT: 43 %
Neutro Abs: 1.9 10*3/uL (ref 1.4–6.5)
PLATELETS: 294 10*3/uL (ref 150–440)
RBC: 4.54 MIL/uL (ref 4.40–5.90)
RDW: 13.4 % (ref 11.5–14.5)
WBC: 4.3 10*3/uL (ref 3.8–10.6)

## 2018-08-17 ENCOUNTER — Inpatient Hospital Stay: Payer: PRIVATE HEALTH INSURANCE | Attending: Oncology

## 2018-08-17 ENCOUNTER — Other Ambulatory Visit: Payer: Self-pay | Admitting: *Deleted

## 2018-08-17 ENCOUNTER — Other Ambulatory Visit: Payer: Self-pay

## 2018-08-17 ENCOUNTER — Inpatient Hospital Stay (HOSPITAL_BASED_OUTPATIENT_CLINIC_OR_DEPARTMENT_OTHER): Payer: PRIVATE HEALTH INSURANCE | Admitting: Oncology

## 2018-08-17 VITALS — BP 132/79 | HR 87 | Temp 96.9°F | Resp 18 | Wt 225.7 lb

## 2018-08-17 DIAGNOSIS — Z79899 Other long term (current) drug therapy: Secondary | ICD-10-CM | POA: Insufficient documentation

## 2018-08-17 DIAGNOSIS — D708 Other neutropenia: Secondary | ICD-10-CM

## 2018-08-17 DIAGNOSIS — D709 Neutropenia, unspecified: Secondary | ICD-10-CM

## 2018-08-17 DIAGNOSIS — I10 Essential (primary) hypertension: Secondary | ICD-10-CM

## 2018-08-17 LAB — CBC WITH DIFFERENTIAL/PLATELET
Abs Immature Granulocytes: 0.01 10*3/uL (ref 0.00–0.07)
BASOS ABS: 0 10*3/uL (ref 0.0–0.1)
BASOS PCT: 1 %
EOS ABS: 0.1 10*3/uL (ref 0.0–0.5)
EOS PCT: 1 %
HEMATOCRIT: 39.7 % (ref 39.0–52.0)
Hemoglobin: 13.5 g/dL (ref 13.0–17.0)
IMMATURE GRANULOCYTES: 0 %
Lymphocytes Relative: 30 %
Lymphs Abs: 1.5 10*3/uL (ref 0.7–4.0)
MCH: 31 pg (ref 26.0–34.0)
MCHC: 34 g/dL (ref 30.0–36.0)
MCV: 91.3 fL (ref 80.0–100.0)
Monocytes Absolute: 0.5 10*3/uL (ref 0.1–1.0)
Monocytes Relative: 10 %
NEUTROS PCT: 58 %
NRBC: 0 % (ref 0.0–0.2)
Neutro Abs: 2.9 10*3/uL (ref 1.7–7.7)
PLATELETS: 265 10*3/uL (ref 150–400)
RBC: 4.35 MIL/uL (ref 4.22–5.81)
RDW: 11.9 % (ref 11.5–15.5)
WBC: 5 10*3/uL (ref 4.0–10.5)

## 2018-08-17 NOTE — Progress Notes (Signed)
Here for follow up. Per pt played 4 games of BB and walked on treadmill this am .

## 2018-08-21 ENCOUNTER — Encounter: Payer: Self-pay | Admitting: Oncology

## 2018-08-21 NOTE — Progress Notes (Signed)
Hematology/Oncology Consult note [facility]  Telephone:([phone] Fax:([phone]  Patient Care Team: Dion Body, MD as PCP - General (Family Medicine)   Name of the patient: William Duffy  [id number]  August 14, 1955   Date of visit: 08/21/18  Diagnosis-neutropenia likely benign  Chief complaint/ Reason for visit-routine follow-up of neutropenia  Heme/Onc history: patient is a 63 year old African-American male with a past medical history significant for hepatitis C for which she has been treated with harvoniin the past. He has been referred to [place] for evaluation of leukopenia/neutropenia. Patient has had chronic neutropenia at least since 2016 and his white count ranges between 2.73.7. Most recent CBC from 10/26/2016 showed white count of 3, H&H of 14.4/41.2 and platelet count of 259. Differential on the neutrophil reveals mild to moderate neutropenia with an absolute neutrophil count ranging between 818- 200.I do not have any prior CBCs before 2016 for comparison. Overall patient is doing well and reports no significant complaints. He denies significant fatigue, loss of appetite or unintentional weight loss. Denies any lumps or bumps anywhere. Denies any recurrent infections. No personal history of cancer family history of any blood disorders.  Results of blood work from 11/04/2016 were as follows: CBC showed white count of 3.7, H&H of 14.8/42.3 and a platelet count of 285. B12 and folate were within normal limits and HIV antibody testing was negative.  WBC waxes and wanes between 2.5-3.5 with predominant neutropenia that has been asymptomatic   Interval history-he is doing well and denies any recurrent infections or hospitalizations.  His appetite is good and he denies any unintentional weight loss or drenching night sweats.  Denies any fatigue  ECOG PS- 0 Pain scale- 0   Review of systems- Review of Systems  Constitutional: Negative for  chills, fever, malaise/fatigue and weight loss.  HENT: Negative for congestion, ear discharge and nosebleeds.   Eyes: Negative for blurred vision.  Respiratory: Negative for cough, hemoptysis, sputum production, shortness of breath and wheezing.   Cardiovascular: Negative for chest pain, palpitations, orthopnea and claudication.  Gastrointestinal: Negative for abdominal pain, blood in stool, constipation, diarrhea, heartburn, melena, nausea and vomiting.  Genitourinary: Negative for dysuria, flank pain, frequency, hematuria and urgency.  Musculoskeletal: Negative for back pain, joint pain and myalgias.  Skin: Negative for rash.  Neurological: Negative for dizziness, tingling, focal weakness, seizures, weakness and headaches.  Endo/Heme/Allergies: Does not bruise/bleed easily.  Psychiatric/Behavioral: Negative for depression and suicidal ideas. The patient does not have insomnia.       Allergies  Allergen Reactions  . Shrimp [Shellfish Allergy] Swelling     Past Medical History:  Diagnosis Date  . Acute lateral meniscus tear of right knee   . Alcohol abuse   . Colon adenomas   . Diverticulosis   . H/O drug abuse   . H/O: gout   . Hepatitis    hepatitis c  . Hypercholesteremia   . Hypertension   . Internal hemorrhoids   . Liver disease   . Neutropenia ([place])   . Obesity      Past Surgical History:  Procedure Laterality Date  . COLONOSCOPY WITH PROPOFOL N/A 01/03/2015   Procedure: COLONOSCOPY WITH PROPOFOL;  Surgeon: Lollie Sails, MD;  Location: [facility] ENDOSCOPY;  Service: Endoscopy;  Laterality: N/A;  . COLONOSCOPY WITH PROPOFOL N/A 05/06/2015   Procedure: COLONOSCOPY WITH PROPOFOL;  Surgeon: Lollie Sails, MD;  Location: [facility] ENDOSCOPY;  Service: Endoscopy;  Laterality: N/A;  . COLONOSCOPY WITH PROPOFOL N/A 06/06/2017  Procedure: COLONOSCOPY WITH PROPOFOL;  Surgeon: Lollie Sails, MD;  Location: [facility] ENDOSCOPY;  Service: Endoscopy;  Laterality: N/A;  . KNEE  ARTHROSCOPY Right   . LIVER BIOPSY    . MENISCUS REPAIR      Social History   Socioeconomic History  . Marital status: Widowed    Spouse name: Not on file  . Number of children: Not on file  . Years of education: Not on file  . Highest education level: Not on file  Occupational History  . Not on file  Social Needs  . Financial resource strain: Not on file  . Food insecurity:    Worry: Not on file    Inability: Not on file  . Transportation needs:    Medical: Not on file    Non-medical: Not on file  Tobacco Use  . Smoking status: Never Smoker  . Smokeless tobacco: Never Used  Substance and Sexual Activity  . Alcohol use: No  . Drug use: No  . Sexual activity: Not on file  Lifestyle  . Physical activity:    Days per week: Not on file    Minutes per session: Not on file  . Stress: Not on file  Relationships  . Social connections:    Talks on phone: Not on file    Gets together: Not on file    Attends religious service: Not on file    Active member of club or organization: Not on file    Attends meetings of clubs or organizations: Not on file    Relationship status: Not on file  . Intimate partner violence:    Fear of current or ex partner: Not on file    Emotionally abused: Not on file    Physically abused: Not on file    Forced sexual activity: Not on file  Other Topics Concern  . Not on file  Social History Narrative  . Not on file    No family history on file.   Current Outpatient Medications:  .  losartan (COZAAR) 25 MG tablet, Take 25 mg daily by mouth., Disp: , Rfl:  .  colchicine 0.6 MG tablet, Take 1 tablet (0.6 mg total) by mouth daily. Take 2 tablets at onset of pain, then may take 1 more 1 hour later if pain continues. (Patient not taking: Reported on 06/03/2017), Disp: 6 tablet, Rfl: 0 .  indomethacin (INDOCIN) 50 MG capsule, Take 50 mg by mouth 3 (three) times daily with meals., Disp: , Rfl:   Physical exam:  Vitals:   08/17/18 1409  BP:  132/79  Pulse: 87  Resp: 18  Temp: (!) 96.9 F (36.1 C)  TempSrc: Tympanic  Weight: 225 lb 11.2 oz (102.4 kg)   Physical Exam Constitutional:      General: He is not in acute distress. HENT:     Head: Normocephalic and atraumatic.  Eyes:     Pupils: Pupils are equal, round, and reactive to light.  Neck:     Musculoskeletal: Normal range of motion.  Cardiovascular:     Rate and Rhythm: Normal rate and regular rhythm.     Heart sounds: Normal heart sounds.  Pulmonary:     Effort: Pulmonary effort is normal.     Breath sounds: Normal breath sounds.  Abdominal:     General: Bowel sounds are normal.     Palpations: Abdomen is soft.  Skin:    General: Skin is warm and dry.  Neurological:     Mental Status: He is  alert and oriented to person, place, and time.      No flowsheet data found. CBC Latest Ref Rng & Units 08/17/2018  WBC 4.0 - 10.5 K/uL 5.0  Hemoglobin 13.0 - 17.0 g/dL 13.5  Hematocrit 39.0 - 52.0 % 39.7  Platelets 150 - 400 K/uL 265     Assessment and plan- Patient is a 63 y.o. male referred for neutropenia likely benign ethnic neutropenia  Patient's white count waxes and wanes between 2.8 to normal.  Over the last 1 year his white count has been around 4.  Absolute neutrophil count ranges between 1-3.  He does not have any other cytopenias.  Suspect he has benign ethnic neutropenia and he can continue to follow-up with Dr. Netty Starring at this time and get a CBC checked every 6 months.  If there is a consistent downward trend in his neutropenia he can be referred back to [place] in the future   Visit Diagnosis 1. Chronic benign neutropenia (HCC)      Dr. Randa Evens, MD, MPH [facility] [phone] 08/21/2018 10:29 AM

## 2019-03-14 ENCOUNTER — Encounter: Payer: Self-pay | Admitting: *Deleted

## 2019-03-23 ENCOUNTER — Other Ambulatory Visit: Payer: Self-pay | Admitting: *Deleted

## 2019-03-23 DIAGNOSIS — Z20822 Contact with and (suspected) exposure to covid-19: Secondary | ICD-10-CM

## 2019-03-24 LAB — NOVEL CORONAVIRUS, NAA: SARS-CoV-2, NAA: NOT DETECTED

## 2019-03-26 ENCOUNTER — Other Ambulatory Visit: Payer: PRIVATE HEALTH INSURANCE

## 2019-03-29 ENCOUNTER — Ambulatory Visit: Payer: PRIVATE HEALTH INSURANCE | Admitting: Anesthesiology

## 2019-03-29 ENCOUNTER — Encounter
Admission: RE | Disposition: A | Discharge status: Home / Self Care | Payer: Self-pay | Source: Home / Self Care | Attending: Ophthalmology

## 2019-03-29 ENCOUNTER — Ambulatory Visit
Admission: RE | Admit: 2019-03-29 | Discharge: 2019-03-29 | Disposition: A | Discharge status: Home / Self Care | Payer: PRIVATE HEALTH INSURANCE | Attending: Ophthalmology | Admitting: Ophthalmology

## 2019-03-29 DIAGNOSIS — E119 Type 2 diabetes mellitus without complications: Secondary | ICD-10-CM | POA: Diagnosis not present

## 2019-03-29 DIAGNOSIS — B1920 Unspecified viral hepatitis C without hepatic coma: Secondary | ICD-10-CM | POA: Insufficient documentation

## 2019-03-29 DIAGNOSIS — M109 Gout, unspecified: Secondary | ICD-10-CM | POA: Diagnosis not present

## 2019-03-29 DIAGNOSIS — E7800 Pure hypercholesterolemia, unspecified: Secondary | ICD-10-CM | POA: Diagnosis not present

## 2019-03-29 DIAGNOSIS — F419 Anxiety disorder, unspecified: Secondary | ICD-10-CM | POA: Insufficient documentation

## 2019-03-29 DIAGNOSIS — I10 Essential (primary) hypertension: Secondary | ICD-10-CM | POA: Insufficient documentation

## 2019-03-29 DIAGNOSIS — E669 Obesity, unspecified: Secondary | ICD-10-CM | POA: Diagnosis not present

## 2019-03-29 DIAGNOSIS — Z6833 Body mass index (BMI) 33.0-33.9, adult: Secondary | ICD-10-CM | POA: Insufficient documentation

## 2019-03-29 DIAGNOSIS — H2512 Age-related nuclear cataract, left eye: Secondary | ICD-10-CM | POA: Diagnosis not present

## 2019-03-29 HISTORY — PX: CATARACT EXTRACTION W/PHACO: SHX586

## 2019-03-29 HISTORY — DX: Type 2 diabetes mellitus without complications: E11.9

## 2019-03-29 LAB — GLUCOSE, CAPILLARY
Glucose-Capillary: 110 mg/dL — ABNORMAL HIGH (ref 70–99)
Glucose-Capillary: 99 mg/dL (ref 70–99)

## 2019-03-29 SURGERY — PHACOEMULSIFICATION, CATARACT, WITH IOL INSERTION
Anesthesia: Monitor Anesthesia Care | Site: Eye | Laterality: Left

## 2019-03-29 MED ORDER — ARMC OPHTHALMIC DILATING DROPS
1.0000 "application " | OPHTHALMIC | Status: AC
  Administered 2019-03-29 (×2): 1 via OPHTHALMIC

## 2019-03-29 MED ORDER — NA HYALUR & NA CHOND-NA HYALUR 0.55-0.5 ML IO KIT
PACK | INTRAOCULAR | Status: DC | PRN
  Administered 2019-03-29: 1 via OPHTHALMIC

## 2019-03-29 MED ORDER — TRYPAN BLUE 0.06 % OP SOLN
OPHTHALMIC | Status: AC
  Filled 2019-03-29: qty 0.5

## 2019-03-29 MED ORDER — LIDOCAINE HCL (PF) 4 % IJ SOLN
INTRAOCULAR | Status: DC | PRN
  Administered 2019-03-29: 4 mL via OPHTHALMIC

## 2019-03-29 MED ORDER — SODIUM CHLORIDE 0.9 % IV SOLN
INTRAVENOUS | Status: DC
  Administered 2019-03-29: 07:00:00 via INTRAVENOUS

## 2019-03-29 MED ORDER — FENTANYL CITRATE (PF) 100 MCG/2ML IJ SOLN
INTRAMUSCULAR | Status: DC | PRN
  Administered 2019-03-29: 50 ug via INTRAVENOUS
  Administered 2019-03-29 (×2): 25 ug via INTRAVENOUS

## 2019-03-29 MED ORDER — TETRACAINE HCL 0.5 % OP SOLN
1.0000 [drp] | OPHTHALMIC | Status: AC | PRN
  Administered 2019-03-29 (×2): 1 [drp] via OPHTHALMIC

## 2019-03-29 MED ORDER — PROPOFOL 10 MG/ML IV BOLUS
INTRAVENOUS | Status: AC
  Filled 2019-03-29: qty 20

## 2019-03-29 MED ORDER — TRYPAN BLUE 0.06 % OP SOLN
OPHTHALMIC | Status: DC | PRN
  Administered 2019-03-29: 0.5 mL via INTRAOCULAR

## 2019-03-29 MED ORDER — ONDANSETRON HCL 4 MG/2ML IJ SOLN
INTRAMUSCULAR | Status: DC | PRN
  Administered 2019-03-29: 4 mg via INTRAVENOUS

## 2019-03-29 MED ORDER — LIDOCAINE HCL (PF) 4 % IJ SOLN
INTRAMUSCULAR | Status: AC
  Filled 2019-03-29: qty 5

## 2019-03-29 MED ORDER — MOXIFLOXACIN HCL 0.5 % OP SOLN
OPHTHALMIC | Status: DC | PRN
  Administered 2019-03-29: 0.2 mL via OPHTHALMIC

## 2019-03-29 MED ORDER — MOXIFLOXACIN HCL 0.5 % OP SOLN - NO CHARGE
1.0000 [drp] | Freq: Once | OPHTHALMIC | Status: DC
  Filled 2019-03-29: qty 3

## 2019-03-29 MED ORDER — TETRACAINE HCL 0.5 % OP SOLN
OPHTHALMIC | Status: AC
  Administered 2019-03-29: 06:00:00 1 [drp] via OPHTHALMIC
  Filled 2019-03-29: qty 4

## 2019-03-29 MED ORDER — NA CHONDROIT SULF-NA HYALURON 40-17 MG/ML IO SOLN
INTRAOCULAR | Status: DC | PRN
  Administered 2019-03-29: 1 mL via INTRAOCULAR

## 2019-03-29 MED ORDER — FENTANYL CITRATE (PF) 100 MCG/2ML IJ SOLN
INTRAMUSCULAR | Status: AC
  Filled 2019-03-29: qty 2

## 2019-03-29 MED ORDER — MIDAZOLAM HCL 2 MG/2ML IJ SOLN
INTRAMUSCULAR | Status: DC | PRN
  Administered 2019-03-29 (×2): 1 mg via INTRAVENOUS

## 2019-03-29 MED ORDER — POVIDONE-IODINE 5 % OP SOLN
OPHTHALMIC | Status: AC
  Filled 2019-03-29: qty 30

## 2019-03-29 MED ORDER — MOXIFLOXACIN HCL 0.5 % OP SOLN
OPHTHALMIC | Status: AC
  Filled 2019-03-29: qty 3

## 2019-03-29 MED ORDER — MIDAZOLAM HCL 2 MG/2ML IJ SOLN
INTRAMUSCULAR | Status: AC
  Filled 2019-03-29: qty 2

## 2019-03-29 MED ORDER — POVIDONE-IODINE 5 % OP SOLN
OPHTHALMIC | Status: DC | PRN
  Administered 2019-03-29: 1 via OPHTHALMIC

## 2019-03-29 MED ORDER — CARBACHOL 0.01 % IO SOLN
INTRAOCULAR | Status: DC | PRN
  Administered 2019-03-29: 0.5 mL via INTRAOCULAR

## 2019-03-29 MED ORDER — EPINEPHRINE PF 1 MG/ML IJ SOLN
INTRAMUSCULAR | Status: AC
  Filled 2019-03-29: qty 2

## 2019-03-29 MED ORDER — EPINEPHRINE PF 1 MG/ML IJ SOLN
INTRAOCULAR | Status: DC | PRN
  Administered 2019-03-29: 1 mL via OPHTHALMIC

## 2019-03-29 MED ORDER — ARMC OPHTHALMIC DILATING DROPS
OPHTHALMIC | Status: AC
  Administered 2019-03-29: 1 via OPHTHALMIC
  Filled 2019-03-29: qty 0.5

## 2019-03-29 SURGICAL SUPPLY — 17 items
DISSECTOR HYDRO NUCLEUS 50X22 (MISCELLANEOUS) ×12 IMPLANT
DRSG TEGADERM 2-3/8X2-3/4 SM (GAUZE/BANDAGES/DRESSINGS) ×3 IMPLANT
GLOVE BIOGEL M 6.5 STRL (GLOVE) ×3 IMPLANT
GOWN STRL REUS W/ TWL LRG LVL3 (GOWN DISPOSABLE) ×1 IMPLANT
GOWN STRL REUS W/ TWL XL LVL3 (GOWN DISPOSABLE) ×1 IMPLANT
GOWN STRL REUS W/TWL LRG LVL3 (GOWN DISPOSABLE) ×2
GOWN STRL REUS W/TWL XL LVL3 (GOWN DISPOSABLE) ×2
KNIFE 45D UP 2.3 (MISCELLANEOUS) ×3 IMPLANT
LABEL CATARACT MEDS ST (LABEL) ×3 IMPLANT
LENS IOL TECNIS ITEC 18.5 (Intraocular Lens) ×3 IMPLANT
PACK CATARACT (MISCELLANEOUS) ×3 IMPLANT
PACK CATARACT KING (MISCELLANEOUS) ×3 IMPLANT
PACK EYE AFTER SURG (MISCELLANEOUS) ×3 IMPLANT
SOL BSS BAG (MISCELLANEOUS) ×3
SOLUTION BSS BAG (MISCELLANEOUS) ×1 IMPLANT
WATER STERILE IRR 250ML POUR (IV SOLUTION) ×3 IMPLANT
WIPE NON LINTING 3.25X3.25 (MISCELLANEOUS) ×3 IMPLANT

## 2019-03-29 NOTE — Anesthesia Post-op Follow-up Note (Signed)
Anesthesia QCDR form completed.        

## 2019-03-29 NOTE — Op Note (Signed)
PREOPERATIVE DIAGNOSIS:  Nuclear sclerotic cataract of the LEFT eye.   POSTOPERATIVE DIAGNOSIS:  Nuclear sclerotic cataract of the LEFT eye.   OPERATIVE PROCEDURE: Cataract surgery OS   SURGEON:  Marchia Meiers, MD.   ANESTHESIA:  Anesthesiologist: Alphonsus Sias, MD CRNA: Aline Brochure, CRNA; Fletcher-Harrison, Administrator, arts, CRNA  1.      Managed anesthesia care. 2.     0.51ml of Shugarcaine was instilled following the paracentesis   COMPLICATIONS:  None.   TECHNIQUE:   Divide and conquer   DESCRIPTION OF PROCEDURE:  The patient was examined and consented in the preoperative holding area where the aforementioned topical anesthesia was applied to the LEFT eye and then brought back to the Operating Room where the left eye was prepped and draped in the usual sterile ophthalmic fashion and a lid speculum was placed. A paracentesis was created with the side port blade, the anterior chamber was washed out with trypan blue to stain the anterior capsule, and the anterior chamber was filled with viscoelastic. A near clear corneal incision was performed with the steel keratome. A continuous curvilinear capsulorrhexis was performed with a cystotome followed by the capsulorrhexis forceps. Hydrodissection and hydrodelineation were carried out with BSS on a blunt cannula. The lens was removed in a divide and conquer  technique and the remaining cortical material was removed with the irrigation-aspiration handpiece. The capsular bag was inflated with viscoelastic and the lens was placed in the capsular bag without complication. The remaining viscoelastic was removed from the eye with the irrigation-aspiration handpiece. The wounds were hydrated. The anterior chamber was flushed and the eye was inflated to physiologic pressure. 0.27ml Vigamox was placed in the anterior chamber. The wounds were found to be water tight. The eye was dressed with Vigamox. The patient was given protective glasses to wear throughout the  day and a shield with which to sleep tonight. The patient was also given drops with which to begin a drop regimen today and will follow-up with me in one day. Implant Name Type Inv. Item Serial No. Manufacturer Lot No. LRB No. Used Action  LENS IOL DIOP 18.5 - [id number] Intraocular Lens LENS IOL DIOP 18.5 [id number] [employer]  Left 1 Implanted    Procedure(s) with comments: CATARACT EXTRACTION PHACO AND INTRAOCULAR LENS PLACEMENT (IOC)  LEFT (Left) - [place] 01:35.7 AP% 14.6 CDE 16.14 Fluid Pack Lot [id number]  Electronically signed: Marchia Meiers 03/29/2019 12:02 PM

## 2019-03-29 NOTE — Anesthesia Preprocedure Evaluation (Signed)
Anesthesia Evaluation  Patient identified by MRN, date of birth, ID band Patient awake    Reviewed: Allergy & Precautions, H&P , NPO status , reviewed documented beta blocker date and time   Airway Mallampati: III  TM Distance: >3 FB Neck ROM: full    Dental  (+) Poor Dentition, Chipped, Missing, Partial Upper   Pulmonary    Pulmonary exam normal        Cardiovascular hypertension, Normal cardiovascular exam     Neuro/Psych PSYCHIATRIC DISORDERS Anxiety    GI/Hepatic (+) Hepatitis -, C  Endo/Other  diabetes, Type 2  Renal/GU      Musculoskeletal   Abdominal   Peds  Hematology   Anesthesia Other Findings Past Medical History: No date: Acute lateral meniscus tear of right knee No date: Alcohol abuse No date: Colon adenomas No date: Diet-controlled diabetes mellitus (HCC)     Comment:  pre-diabetic No date: Diverticulosis No date: H/O drug abuse ([place]) No date: H/O: gout No date: Hepatitis     Comment:  hepatitis c No date: Hypercholesteremia No date: Hypertension No date: Internal hemorrhoids No date: Liver disease No date: Neutropenia ([place]) No date: Obesity  Past Surgical History: 01/03/2015: COLONOSCOPY WITH PROPOFOL; N/A     Comment:  Procedure: COLONOSCOPY WITH PROPOFOL;  Surgeon: Lollie Sails, MD;  Location: [facility] ENDOSCOPY;  Service:               Endoscopy;  Laterality: N/A; 05/06/2015: COLONOSCOPY WITH PROPOFOL; N/A     Comment:  Procedure: COLONOSCOPY WITH PROPOFOL;  Surgeon: Lollie Sails, MD;  Location: [facility] ENDOSCOPY;  Service:               Endoscopy;  Laterality: N/A; 06/06/2017: COLONOSCOPY WITH PROPOFOL; N/A     Comment:  Procedure: COLONOSCOPY WITH PROPOFOL;  Surgeon:               Lollie Sails, MD;  Location: [facility] ENDOSCOPY;                Service: Endoscopy;  Laterality: N/A; No date: KNEE ARTHROSCOPY; Right No date: LIVER BIOPSY No date: MENISCUS  REPAIR  BMI    Body Mass Index: 33.89 kg/m      Reproductive/Obstetrics                             Anesthesia Physical Anesthesia Plan  ASA: III  Anesthesia Plan: MAC   Post-op Pain Management:    Induction: Intravenous  PONV Risk Score and Plan: Treatment may vary due to age or medical condition and TIVA  Airway Management Planned: Nasal Cannula and Natural Airway  Additional Equipment:   Intra-op Plan:   Post-operative Plan:   Informed Consent: I have reviewed the patients History and Physical, chart, labs and discussed the procedure including the risks, benefits and alternatives for the proposed anesthesia with the patient or authorized representative who has indicated his/her understanding and acceptance.     Dental Advisory Given  Plan Discussed with: CRNA  Anesthesia Plan Comments:         Anesthesia Quick Evaluation

## 2019-03-29 NOTE — H&P (Addendum)
   I have reviewed the patient's H&P and agree with its findings. There have been no interval changes.  Beth Goodlin MD Ophthalmology 

## 2019-03-29 NOTE — Discharge Instructions (Addendum)
Eye Surgery Discharge Instructions  Expect mild scratchy sensation or mild soreness. DO NOT RUB YOUR EYE!  The day of surgery:  Minimal physical activity, but bed rest is not required  No reading, computer work, or close hand work  No bending, lifting, or straining.  May watch TV  For 24 hours:  No driving, legal decisions, or alcoholic beverages  Safety precautions  Eat anything you prefer: It is better to start with liquids, then soup then solid foods.  Solar shield eyeglasses should be worn for comfort in the sunlight/patch while sleeping  Resume all regular medications including aspirin or Coumadin if these were discontinued prior to surgery. You may shower, bathe, shave, or wash your hair. Tylenol may be taken for mild discomfort. Follow eye drop instruction sheet as reviewed. Call your doctor if you experience significant pain, nausea, or vomiting, fever > 101 or other signs of infection. [phone] or [phone] Specific instructions:  Follow-up Information    Marchia Meiers, MD Follow up.   Specialty: Ophthalmology Why: 03-30-19 @ 9:30 am Contact information: [address] [place] [address] [phone]

## 2019-03-29 NOTE — Anesthesia Postprocedure Evaluation (Signed)
Anesthesia Post Note  Patient: [name]  Procedure(s) Performed: CATARACT EXTRACTION PHACO AND INTRAOCULAR LENS PLACEMENT (IOC)  LEFT (Left Eye)  Patient location during evaluation: Phase II Anesthesia Type: MAC Level of consciousness: awake and alert Pain management: pain level controlled Vital Signs Assessment: post-procedure vital signs reviewed and stable Respiratory status: spontaneous breathing, nonlabored ventilation and respiratory function stable Cardiovascular status: blood pressure returned to baseline and stable Postop Assessment: no apparent nausea or vomiting Anesthetic complications: no     Last Vitals:  Vitals:   03/29/19 0611 03/29/19 0828  BP: 135/85 134/82  Pulse: 66 62  Resp: 17 16  Temp: (!) 35.9 C (!) 36 C  SpO2: 98% 98%    Last Pain:  Vitals:   03/29/19 0828  TempSrc: Temporal  PainSc: 0-No pain                 Alphonsus Sias

## 2019-03-29 NOTE — Transfer of Care (Signed)
Immediate Anesthesia Transfer of Care Note  Patient: William Duffy  Procedure(s) Performed: CATARACT EXTRACTION PHACO AND INTRAOCULAR LENS PLACEMENT (IOC)  LEFT (Left Eye)  Patient Location: PACU  Anesthesia Type:MAC  Level of Consciousness: awake, alert  and patient cooperative  Airway & Oxygen Therapy: Patient Spontanous Breathing  Post-op Assessment: Report given to RN and Post -op Vital signs reviewed and stable  Post vital signs: Reviewed and stable  Last Vitals:  Vitals Value Taken Time  BP 134/82 03/29/19 0828  Temp 36 C 03/29/19 0828  Pulse 62 03/29/19 0828  Resp 16 03/29/19 0828  SpO2 98 % 03/29/19 0828    Last Pain:  Vitals:   03/29/19 0828  TempSrc: Temporal  PainSc: 0-No pain         Complications: No apparent anesthesia complications

## 2019-05-14 ENCOUNTER — Other Ambulatory Visit: Payer: Self-pay

## 2019-05-14 DIAGNOSIS — Z20822 Contact with and (suspected) exposure to covid-19: Secondary | ICD-10-CM

## 2019-05-17 LAB — NOVEL CORONAVIRUS, NAA: SARS-CoV-2, NAA: NOT DETECTED

## 2020-07-18 ENCOUNTER — Encounter: Payer: Self-pay | Admitting: Emergency Medicine

## 2020-07-18 ENCOUNTER — Ambulatory Visit
Admission: EM | Admit: 2020-07-18 | Discharge: 2020-07-18 | Disposition: A | Discharge status: Home / Self Care | Payer: 59 | Attending: Emergency Medicine | Admitting: Emergency Medicine

## 2020-07-18 ENCOUNTER — Other Ambulatory Visit: Payer: Self-pay

## 2020-07-18 DIAGNOSIS — J069 Acute upper respiratory infection, unspecified: Secondary | ICD-10-CM

## 2020-07-18 DIAGNOSIS — U071 COVID-19: Secondary | ICD-10-CM | POA: Diagnosis not present

## 2020-07-18 DIAGNOSIS — Z20822 Contact with and (suspected) exposure to covid-19: Secondary | ICD-10-CM | POA: Diagnosis present

## 2020-07-18 DIAGNOSIS — Z1152 Encounter for screening for COVID-19: Secondary | ICD-10-CM

## 2020-07-18 LAB — SARS CORONAVIRUS 2 (TAT 6-24 HRS): SARS Coronavirus 2: POSITIVE — AB

## 2020-07-18 MED ORDER — PROMETHAZINE-DM 6.25-15 MG/5ML PO SYRP: 5.0000 mL | ORAL_SOLUTION | Freq: Four times a day (QID) | ORAL | 0 refills | Status: DC | PRN

## 2020-07-18 MED ORDER — BENZONATATE 100 MG PO CAPS: 200.0000 mg | ORAL_CAPSULE | Freq: Three times a day (TID) | ORAL | 0 refills | Status: DC

## 2020-07-18 NOTE — ED Provider Notes (Signed)
[facility]    CSN: [id number] Arrival date & time: 07/18/20  0854      History   Chief Complaint No chief complaint on file.   HPI William Duffy is a 65 y.o. male.   HPI   65 year old male here for evaluation of body aches and chills that started last night.  Patient also is complaining of associated symptoms that include sore throat, nonproductive cough, along with his body aches and chills.  Patient has been vaccinated against COVID but has not received his booster shot yet and he has not taken his flu vaccine.  Patient denies fever, runny nose or nasal congestion, ear pain or pressure, shortness of breath or wheezing, GI complaints, changes to his sense of taste and smell, or known COVID exposure.  Patient has a significant medical history including hypertension polysubstance abuse, liver disease, neutropenia, colon adenomas, and hepatitis.  Past Medical History:  Diagnosis Date  . Acute lateral meniscus tear of right knee   . Alcohol abuse   . Colon adenomas   . Diet-controlled diabetes mellitus ([place])    pre-diabetic  . Diverticulosis   . H/O drug abuse ([place])   . H/O: gout   . Hepatitis    hepatitis c  . Hypercholesteremia   . Hypertension   . Internal hemorrhoids   . Liver disease   . Neutropenia ([place])   . Obesity     Patient Active Problem List   Diagnosis Date Noted  . Other neutropenia ([place]) 11/02/2016  . Borderline diabetes mellitus 09/15/2015  . History of hypertension 09/10/2015    Past Surgical History:  Procedure Laterality Date  . CATARACT EXTRACTION W/PHACO Left 03/29/2019   Procedure: CATARACT EXTRACTION PHACO AND INTRAOCULAR LENS PLACEMENT ([place])  LEFT;  Surgeon: Marchia Meiers, MD;  Location: [facility] ORS;  Service: Ophthalmology;  Laterality: Left;  [place] 01:35.7 AP% 14.6 CDE 16.14 Fluid Pack Lot #[id number]  . COLONOSCOPY WITH PROPOFOL N/A 01/03/2015   Procedure: COLONOSCOPY WITH PROPOFOL;  Surgeon: Lollie Sails, MD;  Location: [facility]  ENDOSCOPY;  Service: Endoscopy;  Laterality: N/A;  . COLONOSCOPY WITH PROPOFOL N/A 05/06/2015   Procedure: COLONOSCOPY WITH PROPOFOL;  Surgeon: Lollie Sails, MD;  Location: [facility] ENDOSCOPY;  Service: Endoscopy;  Laterality: N/A;  . COLONOSCOPY WITH PROPOFOL N/A 06/06/2017   Procedure: COLONOSCOPY WITH PROPOFOL;  Surgeon: Lollie Sails, MD;  Location: [facility] ENDOSCOPY;  Service: Endoscopy;  Laterality: N/A;  . KNEE ARTHROSCOPY Right   . LIVER BIOPSY    . MENISCUS REPAIR         Home Medications    Prior to Admission medications   Medication Sig Start Date End Date Taking? Authorizing Provider  benzonatate (TESSALON) 100 MG capsule Take 2 capsules (200 mg total) by mouth every 8 (eight) hours. 07/18/20  Yes Margarette Canada, NP  promethazine-dextromethorphan (PROMETHAZINE-DM) 6.25-15 MG/5ML syrup Take 5 mLs by mouth 4 (four) times daily as needed. 07/18/20  Yes Margarette Canada, NP  diclofenac sodium (VOLTAREN) 1 % GEL Apply 1 application topically 4 (four) times daily as needed (pain).    [provider]  indomethacin (INDOCIN) 50 MG capsule Take 50 mg by mouth 2 (two) times daily as needed (gout flare).     [provider]  losartan (COZAAR) 25 MG tablet Take 25 mg daily by mouth.    [provider]    Family History History reviewed. No pertinent family history.  Social History Social History   Tobacco Use  . Smoking status: Never Smoker  .  Smokeless tobacco: Never Used  Substance Use Topics  . Alcohol use: No  . Drug use: No     Allergies   Shrimp [shellfish allergy]   Review of Systems Review of Systems  Constitutional: Positive for chills. Negative for activity change, appetite change and fever.  HENT: Positive for sore throat. Negative for congestion, ear pain, rhinorrhea and sinus pressure.   Respiratory: Positive for cough. Negative for shortness of breath and wheezing.   Gastrointestinal: Negative for diarrhea, nausea and vomiting.   Musculoskeletal: Positive for arthralgias and myalgias.  Skin: Negative for rash.  Neurological: Negative for headaches.  Hematological: Negative.   Psychiatric/Behavioral: Negative.      Physical Exam Triage Vital Signs ED Triage Vitals  Enc Vitals Group     BP 07/18/20 0951 (!) 151/90     Pulse Rate 07/18/20 0951 99     Resp 07/18/20 0951 17     Temp 07/18/20 0951 99 F (37.2 C)     Temp Source 07/18/20 0951 Oral     SpO2 07/18/20 0951 95 %     Weight --      Height --      Head Circumference --      Peak Flow --      Pain Score 07/18/20 0948 5     Pain Loc --      Pain Edu? --      Excl. in [place]? --    No data found.  Updated Vital Signs BP (!) 151/90 (BP Location: Left Arm)   Pulse 99   Temp 99 F (37.2 C) (Oral)   Resp 17   SpO2 95%   Visual Acuity Right Eye Distance:   Left Eye Distance:   Bilateral Distance:    Right Eye Near:   Left Eye Near:    Bilateral Near:     Physical Exam Vitals and nursing note reviewed.  Constitutional:      General: He is not in acute distress.    Appearance: Normal appearance. He is normal weight. He is not toxic-appearing.  HENT:     Head: Normocephalic and atraumatic.     Right Ear: Tympanic membrane, ear canal and external ear normal.     Left Ear: Tympanic membrane, ear canal and external ear normal.     Nose: Congestion and rhinorrhea present.     Comments: Nasal mucosa is mildly erythematous and edematous with scant clear nasal discharge.    Mouth/Throat:     Mouth: Mucous membranes are moist.     Pharynx: Oropharynx is clear. No oropharyngeal exudate or posterior oropharyngeal erythema.  Cardiovascular:     Rate and Rhythm: Normal rate and regular rhythm.     Pulses: Normal pulses.     Heart sounds: Normal heart sounds. No murmur heard. No gallop.   Pulmonary:     Effort: Pulmonary effort is normal.     Breath sounds: Normal breath sounds. No wheezing, rhonchi or rales.  Musculoskeletal:     Cervical  back: Normal range of motion and neck supple.  Lymphadenopathy:     Cervical: No cervical adenopathy.  Skin:    General: Skin is warm and dry.     Capillary Refill: Capillary refill takes less than 2 seconds.     Findings: No erythema or rash.  Neurological:     General: No focal deficit present.     Mental Status: He is alert.  Psychiatric:        Mood and Affect: Mood normal.  Behavior: Behavior normal.        Thought Content: Thought content normal.        Judgment: Judgment normal.      UC Treatments / Results  Labs (all labs ordered are listed, but only abnormal results are displayed) Labs Reviewed  SARS CORONAVIRUS 2 (TAT 6-24 HRS)    EKG   Radiology No results found.  Procedures Procedures (including critical care time)  Medications Ordered in UC Medications - No data to display  Initial Impression / Assessment and Plan / UC Course  I have reviewed the triage vital signs and the nursing notes.  Pertinent labs & imaging results that were available during my care of the patient were reviewed by me and considered in my medical decision making (see chart for details).   Patient is here for evaluation of COVID-like symptoms that began last night.  Patient is nontoxic in appearance and is not in any acute distress.  Patient does have some erythema and edema to his nasal mucosa.  No anterior cervical lymphadenopathy appreciated on exam and lungs are clear to auscultation all fields.  Will swab patient for COVID and discharge patient home to use Tylenol and ibuprofen as needed for body aches and fever, Tessalon Perles and Promethazine DM for cough and congestion, and ER precautions reviewed with patient.   Final Clinical Impressions(s) / UC Diagnoses   Final diagnoses:  Encounter for screening for COVID-19  Viral URI with cough     Discharge Instructions     Isolate at home pending the results of your COVID test.  If you're positive you will need to  quarantine for 5 days from the onset of your symptoms.  After 5 days you can break quarantine if your symptoms have improved, you have not had a fever in 24 hours, and as long as you wear a mask around other people for an additional 5 days.  Use over-the-counter Tylenol and ibuprofen as needed for fever and pain.  Use the Tessalon Perles during the day as needed for cough and the Promethazine DM cough syrup at bedtime as it will make you drowsy.  If you develop shortness of breath-especially at rest, you are unable to speak in complete sentences, or your lips are turning blue you need go to the ER for evaluation.    ED Prescriptions    Medication Sig Dispense Auth. Provider   benzonatate (TESSALON) 100 MG capsule Take 2 capsules (200 mg total) by mouth every 8 (eight) hours. 21 capsule Margarette Canada, NP   promethazine-dextromethorphan (PROMETHAZINE-DM) 6.25-15 MG/5ML syrup Take 5 mLs by mouth 4 (four) times daily as needed. 118 mL Margarette Canada, NP     PDMP not reviewed this encounter.   Margarette Canada, NP 07/18/20 1019

## 2020-07-18 NOTE — Discharge Instructions (Addendum)
Isolate at home pending the results of your COVID test.  If you're positive you will need to quarantine for 5 days from the onset of your symptoms.  After 5 days you can break quarantine if your symptoms have improved, you have not had a fever in 24 hours, and as long as you wear a mask around other people for an additional 5 days.  Use over-the-counter Tylenol and ibuprofen as needed for fever and pain.  Use the Tessalon Perles during the day as needed for cough and the Promethazine DM cough syrup at bedtime as it will make you drowsy.  If you develop shortness of breath-especially at rest, you are unable to speak in complete sentences, or your lips are turning blue you need go to the ER for evaluation.

## 2020-07-18 NOTE — ED Triage Notes (Signed)
PT states that he has body aches and chills that started yesterday. Denies any fever.

## 2020-07-19 ENCOUNTER — Telehealth: Payer: Self-pay | Admitting: Family

## 2020-07-19 NOTE — Telephone Encounter (Signed)
Called to discuss with patient about COVID-19 symptoms and the use of one of the available treatments for those with mild to moderate Covid symptoms and at a high risk of hospitalization.  Pt appears to qualify for outpatient treatment due to co-morbid conditions and/or a member of an at-risk group in accordance with the FDA Emergency Use Authorization.    Symptom onset: 1/13 Vaccinated: Yes Careers adviser? No  Immunocompromised? No  Qualifiers: BMI >34, hypertension, elevated SVI risk score, hyperlipidemia,   William Duffy was seen at the [facility] on 1/14 with body aches, fever and chills starting on 1/13. Vaccinated without booster. Covid test positive.   I spoke with William Duffy regarding the risks and benefits of treatment and he is not interested in receiving treatment at this time. Call back number provided should he change his mind.   Terri Piedra, NP 07/19/2020 9:32 AM

## 2020-09-02 DIAGNOSIS — Z8042 Family history of malignant neoplasm of prostate: Secondary | ICD-10-CM | POA: Diagnosis not present

## 2020-09-02 DIAGNOSIS — Z125 Encounter for screening for malignant neoplasm of prostate: Secondary | ICD-10-CM | POA: Diagnosis not present

## 2020-09-02 DIAGNOSIS — I10 Essential (primary) hypertension: Secondary | ICD-10-CM | POA: Diagnosis not present

## 2020-09-02 DIAGNOSIS — R7303 Prediabetes: Secondary | ICD-10-CM | POA: Diagnosis not present

## 2020-09-02 DIAGNOSIS — D708 Other neutropenia: Secondary | ICD-10-CM | POA: Diagnosis not present

## 2020-09-09 DIAGNOSIS — R7303 Prediabetes: Secondary | ICD-10-CM | POA: Diagnosis not present

## 2020-09-09 DIAGNOSIS — E785 Hyperlipidemia, unspecified: Secondary | ICD-10-CM | POA: Diagnosis not present

## 2020-09-09 DIAGNOSIS — Z6836 Body mass index (BMI) 36.0-36.9, adult: Secondary | ICD-10-CM | POA: Diagnosis not present

## 2020-09-09 DIAGNOSIS — Z0000 Encounter for general adult medical examination without abnormal findings: Secondary | ICD-10-CM | POA: Diagnosis not present

## 2020-09-09 DIAGNOSIS — E669 Obesity, unspecified: Secondary | ICD-10-CM | POA: Diagnosis not present

## 2020-09-12 DIAGNOSIS — H6123 Impacted cerumen, bilateral: Secondary | ICD-10-CM | POA: Diagnosis not present

## 2020-09-12 DIAGNOSIS — H9202 Otalgia, left ear: Secondary | ICD-10-CM | POA: Diagnosis not present

## 2020-09-18 ENCOUNTER — Ambulatory Visit: Payer: Self-pay | Admitting: Urology

## 2020-09-25 ENCOUNTER — Other Ambulatory Visit: Payer: Self-pay

## 2020-09-25 ENCOUNTER — Encounter: Payer: Self-pay | Admitting: Urology

## 2020-09-25 ENCOUNTER — Ambulatory Visit: Payer: Medicare HMO | Admitting: Urology

## 2020-09-25 VITALS — BP 131/84 | HR 71 | Ht 66.0 in | Wt 220.0 lb

## 2020-09-25 DIAGNOSIS — R972 Elevated prostate specific antigen [PSA]: Secondary | ICD-10-CM

## 2020-09-25 NOTE — Progress Notes (Signed)
09/25/2020 8:59 AM   William Duffy 24-Sep-1955 [id number]  Referring provider: Dion Body, MD [facility] [place],  [place] [address]  Chief Complaint  Patient presents with  . Elevated PSA    HPI: William Duffy is a 65 y.o. male referred for evaluation of an elevated PSA.   PSA drawn 09/02/2020 elevated at 19.16  Prior PSA 04/2019 was 3.55  Mild lower urinary tract symptoms nocturia x1-2  Denies dysuria, gross hematuria  Denies flank, abdominal, pelvic pain  Family history prostate cancer in father; thinks he was diagnosed in his early 81s  States he had been doing a lot of cycling on stationary bike but none in the last few weeks   PMH: Past Medical History:  Diagnosis Date  . Acute lateral meniscus tear of right knee   . Alcohol abuse   . Colon adenomas   . Diet-controlled diabetes mellitus ([place])    pre-diabetic  . Diverticulosis   . H/O drug abuse ([place])   . H/O: gout   . Hepatitis    hepatitis c  . Hypercholesteremia   . Hypertension   . Internal hemorrhoids   . Liver disease   . Neutropenia ([place])   . Obesity     Surgical History: Past Surgical History:  Procedure Laterality Date  . CATARACT EXTRACTION W/PHACO Left 03/29/2019   Procedure: CATARACT EXTRACTION PHACO AND INTRAOCULAR LENS PLACEMENT ([place])  LEFT;  Surgeon: Marchia Meiers, MD;  Location: [facility] ORS;  Service: Ophthalmology;  Laterality: Left;  [place] 01:35.7 AP% 14.6 CDE 16.14 Fluid Pack Lot #[id number] H  . COLONOSCOPY WITH PROPOFOL N/A 01/03/2015   Procedure: COLONOSCOPY WITH PROPOFOL;  Surgeon: Lollie Sails, MD;  Location: [facility] ENDOSCOPY;  Service: Endoscopy;  Laterality: N/A;  . COLONOSCOPY WITH PROPOFOL N/A 05/06/2015   Procedure: COLONOSCOPY WITH PROPOFOL;  Surgeon: Lollie Sails, MD;  Location: [facility] ENDOSCOPY;  Service: Endoscopy;  Laterality: N/A;  . COLONOSCOPY WITH PROPOFOL N/A 06/06/2017   Procedure: COLONOSCOPY WITH PROPOFOL;  Surgeon:  Lollie Sails, MD;  Location: [facility] ENDOSCOPY;  Service: Endoscopy;  Laterality: N/A;  . KNEE ARTHROSCOPY Right   . LIVER BIOPSY    . MENISCUS REPAIR      Home Medications:  Allergies as of 09/25/2020      Reactions   Cetirizine Hives, Swelling   Throat swells   Shrimp [shellfish Allergy] Hives, Swelling   Throat swells      Medication List       Accurate as of September 25, 2020  8:59 AM. If you have any questions, ask your nurse or doctor.        STOP taking these medications   benzonatate 100 MG capsule Commonly known as: TESSALON Stopped by: Abbie Sons, MD   diclofenac sodium 1 % Gel Commonly known as: VOLTAREN Stopped by: Abbie Sons, MD   promethazine-dextromethorphan 6.25-15 MG/5ML syrup Commonly known as: PROMETHAZINE-DM Stopped by: Abbie Sons, MD     TAKE these medications   indomethacin 50 MG capsule Commonly known as: INDOCIN Take 50 mg by mouth 2 (two) times daily as needed (gout flare).   losartan 25 MG tablet Commonly known as: COZAAR Take 25 mg daily by mouth.       Allergies:  Allergies  Allergen Reactions  . Cetirizine Hives and Swelling    Throat swells  . Shrimp [Shellfish Allergy] Hives and Swelling    Throat swells    Family History: History reviewed. No pertinent family history.  Social History:  reports that he has never smoked. He has never used smokeless tobacco. He reports that he does not drink alcohol and does not use drugs.   Physical Exam: BP 131/84   Pulse 71   Ht 5\' 6"  (1.676 m)   Wt 220 lb (99.8 kg)   BMI 35.51 kg/m   Constitutional:  Alert and oriented, No acute distress. HEENT: [place] AT, moist mucus membranes.  Trachea midline, no masses. Cardiovascular: No clubbing, cyanosis, or edema. Respiratory: Normal respiratory effort, no increased work of breathing. GU: Prostate 35 g, smooth without nodules Skin: No rashes, bruises or suspicious lesions. Neurologic: Grossly intact, no focal deficits,  moving all 4 extremities. Psychiatric: Normal mood and affect.   Assessment & Plan:    1.  Elevated PSA  Marked PSA elevation from baseline  Benign DRE  Although PSA is a prostate cancer screening test he was informed that cancer is not the most common cause of an elevated PSA. Other potential causes including BPH and inflammation were discussed. He was informed that the only way to adequately diagnose prostate cancer would be a transrectal ultrasound and biopsy of the prostate. The procedure was discussed including potential risks of bleeding and infection/sepsis. He was also informed that a negative biopsy does not conclusively rule out the possibility that prostate cancer may be present and that continued monitoring is required. The use of multiparametric prostate MRI to identify lesions suspicious for high-grade prostate cancer was discussed.  Have initially recommended repeating the PSA to make sure this is not a transient elevation  Urinalysis to assess for pyuria  If PSA remains persistently elevated >10 recommend scheduling prostate biopsy   Abbie Sons, MD  [facility] [address] [place], [place] [address] [phone]

## 2020-09-26 LAB — URINALYSIS, COMPLETE
Bilirubin, UA: NEGATIVE
Glucose, UA: NEGATIVE
Ketones, UA: NEGATIVE
Leukocytes,UA: NEGATIVE
Nitrite, UA: NEGATIVE
Protein,UA: NEGATIVE
RBC, UA: NEGATIVE
Specific Gravity, UA: >1.03 — ABNORMAL HIGH (ref 1.005–1.030)
Urobilinogen, Ur: 0.2 mg/dL (ref 0.2–1.0)
pH, UA: 5 (ref 5.0–7.5)

## 2020-09-26 LAB — MICROSCOPIC EXAMINATION
Bacteria, UA: NONE SEEN
Epithelial Cells (non renal): NONE SEEN /hpf (ref 0–10)
RBC, Urine: NONE SEEN /hpf (ref 0–2)

## 2020-09-26 LAB — PSA: Prostate Specific Ag, Serum: 19.3 ng/mL — ABNORMAL HIGH (ref 0.0–4.0)

## 2020-09-29 ENCOUNTER — Telehealth: Payer: Self-pay | Admitting: *Deleted

## 2020-09-29 NOTE — Telephone Encounter (Signed)
-----   Message from Abbie Sons, MD sent at 09/26/2020  2:23 PM EDT ----- Repeat PSA persistently elevated at 19.3.  As we discussed at office visit recommend scheduling prostate biopsy

## 2020-09-29 NOTE — Telephone Encounter (Signed)
Notified patient as instructed, patient pleased. Discussed follow-up appointments, patient agrees  

## 2020-10-10 ENCOUNTER — Other Ambulatory Visit: Payer: Self-pay | Admitting: Urology

## 2020-11-17 ENCOUNTER — Encounter: Payer: Self-pay | Admitting: Urology

## 2020-11-17 ENCOUNTER — Ambulatory Visit: Payer: Medicare HMO | Admitting: Urology

## 2020-11-17 ENCOUNTER — Other Ambulatory Visit: Payer: Self-pay

## 2020-11-17 VITALS — BP 141/92 | HR 77 | Ht 70.0 in | Wt 218.0 lb

## 2020-11-17 DIAGNOSIS — R972 Elevated prostate specific antigen [PSA]: Secondary | ICD-10-CM

## 2020-11-17 MED ORDER — CEFTRIAXONE SODIUM 1 G IJ SOLR
1.0000 g | Freq: Once | INTRAMUSCULAR | Status: AC
  Administered 2020-11-17: 1 g via INTRAMUSCULAR

## 2020-11-17 NOTE — Progress Notes (Signed)
Prostate Biopsy Procedure   Informed consent was obtained after discussing risks/benefits of the procedure.  A time out was performed to ensure correct patient identity.  Pre-Procedure: - Last PSA Level: 19.3 (09/25/2020) - Benign DRE - Gentamicin given prophylactically - Levaquin 500 mg administered PO -Transrectal Ultrasound performed revealing a 33 gm prostate -No significant hypoechoic or median lobe noted - PSAD 0.58  Procedure: - Prostate block performed using 10 cc 1% lidocaine and biopsies taken from sextant areas, a total of 12 under ultrasound guidance.  Post-Procedure: - Patient tolerated the procedure well - He was counseled to seek immediate medical attention if experiences any severe pain, significant bleeding, or fevers - Will call with biopsy results   John Giovanni, MD

## 2020-11-18 LAB — SURGICAL PATHOLOGY

## 2020-11-21 ENCOUNTER — Encounter: Payer: Self-pay | Admitting: Urology

## 2021-01-07 DIAGNOSIS — E7800 Pure hypercholesterolemia, unspecified: Secondary | ICD-10-CM | POA: Diagnosis not present

## 2021-01-07 DIAGNOSIS — R7303 Prediabetes: Secondary | ICD-10-CM | POA: Diagnosis not present

## 2021-01-09 DIAGNOSIS — Z6835 Body mass index (BMI) 35.0-35.9, adult: Secondary | ICD-10-CM | POA: Diagnosis not present

## 2021-01-09 DIAGNOSIS — I10 Essential (primary) hypertension: Secondary | ICD-10-CM | POA: Diagnosis not present

## 2021-01-09 DIAGNOSIS — D709 Neutropenia, unspecified: Secondary | ICD-10-CM | POA: Diagnosis not present

## 2021-01-09 DIAGNOSIS — E7800 Pure hypercholesterolemia, unspecified: Secondary | ICD-10-CM | POA: Diagnosis not present

## 2021-01-09 DIAGNOSIS — R7303 Prediabetes: Secondary | ICD-10-CM | POA: Diagnosis not present

## 2021-03-16 DIAGNOSIS — N5239 Other post-surgical erectile dysfunction: Secondary | ICD-10-CM | POA: Diagnosis not present

## 2021-05-25 ENCOUNTER — Other Ambulatory Visit: Payer: Self-pay

## 2021-05-25 DIAGNOSIS — R972 Elevated prostate specific antigen [PSA]: Secondary | ICD-10-CM

## 2021-05-26 ENCOUNTER — Other Ambulatory Visit: Payer: Self-pay

## 2021-05-26 ENCOUNTER — Other Ambulatory Visit: Payer: Medicare HMO

## 2021-05-26 ENCOUNTER — Ambulatory Visit: Payer: Medicare HMO

## 2021-05-26 DIAGNOSIS — R972 Elevated prostate specific antigen [PSA]: Secondary | ICD-10-CM | POA: Diagnosis not present

## 2021-05-27 LAB — PSA: Prostate Specific Ag, Serum: 48.3 ng/mL — ABNORMAL HIGH (ref 0.0–4.0)

## 2021-06-01 ENCOUNTER — Encounter: Payer: Self-pay | Admitting: Urology

## 2021-06-01 ENCOUNTER — Ambulatory Visit: Payer: Medicare HMO | Admitting: Urology

## 2021-06-01 ENCOUNTER — Other Ambulatory Visit: Payer: Self-pay

## 2021-06-01 VITALS — BP 148/89 | HR 75 | Ht 66.0 in | Wt 219.0 lb

## 2021-06-01 DIAGNOSIS — R972 Elevated prostate specific antigen [PSA]: Secondary | ICD-10-CM | POA: Diagnosis not present

## 2021-06-01 DIAGNOSIS — R3989 Other symptoms and signs involving the genitourinary system: Secondary | ICD-10-CM

## 2021-06-01 LAB — MICROSCOPIC EXAMINATION
Bacteria, UA: NONE SEEN
Epithelial Cells (non renal): NONE SEEN /hpf (ref 0–10)

## 2021-06-01 LAB — URINALYSIS, COMPLETE
Bilirubin, UA: NEGATIVE
Glucose, UA: NEGATIVE
Ketones, UA: NEGATIVE
Leukocytes,UA: NEGATIVE
Nitrite, UA: NEGATIVE
Protein,UA: NEGATIVE
Specific Gravity, UA: 1.01 (ref 1.005–1.030)
Urobilinogen, Ur: 0.2 mg/dL (ref 0.2–1.0)
pH, UA: 5.5 (ref 5.0–7.5)

## 2021-06-01 MED ORDER — TADALAFIL 20 MG PO TABS: ORAL_TABLET | ORAL | 0 refills | Status: DC

## 2021-06-01 NOTE — Progress Notes (Signed)
06/01/2021 3:17 PM   William Duffy 14-Nov-1955 [id number]  Referring provider: Dion Body, MD [facility] [place],  [place] [address]  Chief Complaint  Patient presents with   Elevated PSA    Urologic history:  1.  Elevated PSA Biopsy 11/17/2020 PSA 19.3; benign DRE 33 g prostate Benign pathology  HPI: 65 y.o. male presents for 6 month follow-up  Doing well since last visit No bothersome LUTS; mild urinary frequency Denies dysuria, gross hematuria Denies flank, abdominal or pelvic pain PSA 05/26/2021 increased to 48.3   PMH: Past Medical History:  Diagnosis Date   Acute lateral meniscus tear of right knee    Alcohol abuse    Colon adenomas    Diet-controlled diabetes mellitus ([place])    pre-diabetic   Diverticulosis    H/O drug abuse ([place])    H/O: gout    Hepatitis    hepatitis c   Hypercholesteremia    Hypertension    Internal hemorrhoids    Liver disease    Neutropenia ([place])    Obesity     Surgical History: Past Surgical History:  Procedure Laterality Date   CATARACT EXTRACTION W/PHACO Left 03/29/2019   Procedure: CATARACT EXTRACTION PHACO AND INTRAOCULAR LENS PLACEMENT ([place])  LEFT;  Surgeon: Marchia Meiers, MD;  Location: [facility] ORS;  Service: Ophthalmology;  Laterality: Left;  [place] 01:35.7 AP% 14.6 CDE 16.14 Fluid Pack Lot #[id number] H   COLONOSCOPY WITH PROPOFOL N/A 01/03/2015   Procedure: COLONOSCOPY WITH PROPOFOL;  Surgeon: Lollie Sails, MD;  Location: [facility] ENDOSCOPY;  Service: Endoscopy;  Laterality: N/A;   COLONOSCOPY WITH PROPOFOL N/A 05/06/2015   Procedure: COLONOSCOPY WITH PROPOFOL;  Surgeon: Lollie Sails, MD;  Location: [facility] ENDOSCOPY;  Service: Endoscopy;  Laterality: N/A;   COLONOSCOPY WITH PROPOFOL N/A 06/06/2017   Procedure: COLONOSCOPY WITH PROPOFOL;  Surgeon: Lollie Sails, MD;  Location: [facility] ENDOSCOPY;  Service: Endoscopy;  Laterality: N/A;   KNEE ARTHROSCOPY Right    LIVER BIOPSY      MENISCUS REPAIR      Home Medications:  Allergies as of 06/01/2021       Reactions   Cetirizine Hives, Swelling   Throat swells   Shrimp [shellfish Allergy] Hives, Swelling   Throat swells        Medication List        Accurate as of June 01, 2021  3:17 PM. If you have any questions, ask your nurse or doctor.          indomethacin 50 MG capsule Commonly known as: INDOCIN Take 50 mg by mouth 2 (two) times daily as needed (gout flare).   losartan 25 MG tablet Commonly known as: COZAAR Take 25 mg daily by mouth.   sildenafil 50 MG tablet Commonly known as: VIAGRA Take by mouth.   tadalafil 20 MG tablet Commonly known as: CIALIS Take 1 tablet 1 hour prior to intercourse. Started by: Abbie Sons, MD        Allergies:  Allergies  Allergen Reactions   Cetirizine Hives and Swelling    Throat swells   Shrimp [Shellfish Allergy] Hives and Swelling    Throat swells    Family History: History reviewed. No pertinent family history.  Social History:  reports that he has never smoked. He has never used smokeless tobacco. He reports that he does not drink alcohol and does not use drugs.   Physical Exam: BP (!) 148/89   Pulse 75   Ht 5\' 6"  (1.676 m)  Wt 219 lb (99.3 kg)   BMI 35.35 kg/m   Constitutional:  Alert and oriented, No acute distress. HEENT: [place] AT, moist mucus membranes.  Trachea midline, no masses. Cardiovascular: No clubbing, cyanosis, or edema. Respiratory: Normal respiratory effort, no increased work of breathing. GI: Abdomen is soft, nontender, nondistended, no abdominal masses GU: 40 g, asymmetric with increased size and firmness right prostate Skin: No rashes, bruises or suspicious lesions. Neurologic: Grossly intact, no focal deficits, moving all 4 extremities. Psychiatric: Normal mood and affect.  Laboratory Data:  Urinalysis Dipstick/microscopy negative   Assessment & Plan:    1.  Elevated PSA UA shows no  pyuria Abnormal DRE which was not present at time of biopsy May 2022 Significant PSA bump Schedule repeat biopsy.  Based on abnormal DRE, significant PSA rise will perform template biopsy ASAP and lieu of fusion biopsy in [facility], [place] [address] [place], [place] [address] [phone]

## 2021-06-08 ENCOUNTER — Encounter: Payer: Self-pay | Admitting: Urology

## 2021-06-09 ENCOUNTER — Encounter: Payer: Self-pay | Admitting: *Deleted

## 2021-06-24 ENCOUNTER — Ambulatory Visit: Payer: Medicare HMO | Admitting: Urology

## 2021-06-24 ENCOUNTER — Other Ambulatory Visit: Payer: Self-pay

## 2021-06-24 ENCOUNTER — Encounter: Payer: Self-pay | Admitting: Urology

## 2021-06-24 VITALS — BP 140/85 | HR 59 | Ht 66.0 in | Wt 220.0 lb

## 2021-06-24 DIAGNOSIS — R972 Elevated prostate specific antigen [PSA]: Secondary | ICD-10-CM | POA: Diagnosis not present

## 2021-06-24 MED ORDER — GENTAMICIN SULFATE 40 MG/ML IJ SOLN
80.0000 mg | Freq: Once | INTRAMUSCULAR | Status: AC
Start: 2021-06-24 — End: 2021-06-24
  Administered 2021-06-24: 80 mg via INTRAMUSCULAR

## 2021-06-24 MED ORDER — LEVOFLOXACIN 500 MG PO TABS
500.0000 mg | ORAL_TABLET | Freq: Once | ORAL | Status: AC
  Administered 2021-06-24: 500 mg via ORAL

## 2021-06-24 NOTE — Progress Notes (Signed)
William Duffy was scheduled for prostate biopsy today.  He had discomfort on probe insertion and I was unable to adequately image the prostate for biopsy guidance.  Will schedule TRUS/biopsy prostate in the OR under sedation.

## 2021-06-29 ENCOUNTER — Other Ambulatory Visit: Payer: Self-pay | Admitting: Urology

## 2021-06-29 DIAGNOSIS — R972 Elevated prostate specific antigen [PSA]: Secondary | ICD-10-CM

## 2021-06-29 NOTE — Progress Notes (Signed)
Surgical Physician Order Form [facility] [place]  * Scheduling expectation : Next Available  *Length of Case: 30 min  *Clearance needed: no  *Anticoagulation Instructions: N/A  *Aspirin Instructions:  hold indomethacin  *Post-op visit Date/Instructions:  1-2 week with pathology review  *Diagnosis: Elevated PSA  *Procedure:  N/A   TRUS/prostte biopsy   Additional orders: N/A  -Admit type: OUTpatient  -Anesthesia: MAC  -VTE Prophylaxis Standing Order SCDs       Other:   -Standing Lab Orders Per Anesthesia    Lab other: UA&Urine Culture  -Standing Test orders EKG/Chest x-ray per Anesthesia       Test other:   - Medications:  Ceftriaxone(Rocephin) 1gm IV  -Other orders:  Fleets enema AM

## 2021-06-30 ENCOUNTER — Other Ambulatory Visit: Payer: Self-pay | Admitting: Urology

## 2021-06-30 DIAGNOSIS — R972 Elevated prostate specific antigen [PSA]: Secondary | ICD-10-CM

## 2021-06-30 NOTE — Progress Notes (Signed)
[facility] Posting Form   Surgery Date/Time: Date: 07/21/2021  Surgeon: Dr. John Giovanni, MD  Surgery Location: Day Surgery  Inpt ( Yes  )   Outpt (Yes)   Obs ( No  )   Diagnosis: Elevated PSA R97.20  -CPT: [phone]  Surgery: Transrectal Ultrasound Guided Prostate Biopsy  Stop Anticoagulations: Yes, hold indomethacin  Cardiac/Medical/Pulmonary Clearance needed: no  *Orders entered into EPIC  Date: 06/30/21   *Case booked in EPIC  Date: 06/30/21  *Notified pt of Surgery: Date: 06/30/21  PRE-OP UA & CX: Yes, will obtain on 07/02/2021 in clinic  *Placed into Prior Authorization Work Fabio Bering Date: 06/30/21   Assistant/laser/rep:No  Will need to do a fleets enema morning before surgery.

## 2021-07-01 ENCOUNTER — Other Ambulatory Visit: Payer: Self-pay | Admitting: *Deleted

## 2021-07-01 DIAGNOSIS — R3989 Other symptoms and signs involving the genitourinary system: Secondary | ICD-10-CM

## 2021-07-01 DIAGNOSIS — R972 Elevated prostate specific antigen [PSA]: Secondary | ICD-10-CM

## 2021-07-02 ENCOUNTER — Other Ambulatory Visit: Payer: Self-pay

## 2021-07-02 ENCOUNTER — Other Ambulatory Visit: Payer: Medicare HMO

## 2021-07-02 DIAGNOSIS — R3989 Other symptoms and signs involving the genitourinary system: Secondary | ICD-10-CM

## 2021-07-08 LAB — CULTURE, URINE COMPREHENSIVE

## 2021-07-13 ENCOUNTER — Encounter
Admission: RE | Admit: 2021-07-13 | Discharge: 2021-07-13 | Disposition: A | Discharge status: Home / Self Care | Payer: Medicare HMO | Source: Ambulatory Visit | Attending: Urology | Admitting: Urology

## 2021-07-13 ENCOUNTER — Other Ambulatory Visit: Payer: Self-pay

## 2021-07-13 VITALS — Ht 66.0 in | Wt 219.0 lb

## 2021-07-13 DIAGNOSIS — R7303 Prediabetes: Secondary | ICD-10-CM

## 2021-07-13 DIAGNOSIS — D708 Other neutropenia: Secondary | ICD-10-CM

## 2021-07-13 DIAGNOSIS — Z8679 Personal history of other diseases of the circulatory system: Secondary | ICD-10-CM

## 2021-07-13 DIAGNOSIS — B1921 Unspecified viral hepatitis C with hepatic coma: Secondary | ICD-10-CM

## 2021-07-13 NOTE — Patient Instructions (Addendum)
Your procedure is scheduled on: 07/21/21 Report to [place]. To find out your arrival time please call [phone] between 1PM - 3PM on 07/20/21.  Remember: Instructions that are not followed completely may result in serious medical risk, up to and including death, or upon the discretion of your surgeon and anesthesiologist your surgery may need to be rescheduled.     _X__ 1. Do not eat food or drink any liquids after midnight the night before your procedure.                 No gum chewing or hard candies.   __X__2.  On the morning of surgery brush your teeth with toothpaste and water, you                 may rinse your mouth with mouthwash if you wish.  Do not swallow any              toothpaste of mouthwash.     _X__ 3.  No Alcohol for 24 hours before or after surgery.   _X__ 4.  Do Not Smoke or use e-cigarettes For 24 Hours Prior to Your Surgery.                 Do not use any chewable tobacco products for at least 6 hours prior to                 surgery.  ____  5.  Bring all medications with you on the day of surgery if instructed.   __X__  6.  Notify your doctor if there is any change in your medical condition      (cold, fever, infections).     Do not wear jewelry, make-up, hairpins, clips or nail polish. Do not wear lotions, powders, or perfumes.  Do not shave body hair 48 hours prior to surgery. Men may shave face and neck. Do not bring valuables to the hospital.    [facility] is not responsible for any belongings or valuables.  Contacts, dentures/partials or body piercings may not be worn into surgery. Bring a case for your contacts, glasses or hearing aids, a denture cup will be supplied. Leave your suitcase in the car. After surgery it may be brought to your room. For patients admitted to the hospital, discharge time is determined by your treatment team.   Patients discharged the day of surgery will not be  allowed to drive home.   Please read over the following fact sheets that you were given:     __X__ Take these medicines the morning of surgery with A SIP OF WATER:    1. lovastatin (MEVACOR) 20 MG tablet  2.   3.   4.  5.  6.  __X__ Fleet Enema (as directed)   ____ Use CHG Soap/SAGE wipes as directed  ____ Use inhalers on the day of surgery  ____ Stop metformin/Janumet/Farxiga 2 days prior to surgery    ____ Take 1/2 of usual insulin dose the night before surgery. No insulin the morning          of surgery.   ____ Stop Blood Thinners Coumadin/Plavix/Xarelto/Pleta/Pradaxa/Eliquis/Effient/Aspirin  on   Or contact your Surgeon, Cardiologist or Medical Doctor regarding  ability to stop your blood thinners  __X__ Stop Anti-inflammatories 7 days before surgery such as Advil, Ibuprofen, Motrin,  BC or Goodies Powder, Naprosyn, Naproxen, Aleve, Aspirin    __X__ Stop all herbals and  supplements, fish oil or vitamins  until after surgery.    ____ Bring C-Pap to the hospital.

## 2021-07-14 ENCOUNTER — Other Ambulatory Visit
Admission: RE | Admit: 2021-07-14 | Discharge: 2021-07-14 | Disposition: A | Discharge status: Home / Self Care | Payer: Medicare HMO | Source: Ambulatory Visit | Attending: Urology | Admitting: Urology

## 2021-07-14 DIAGNOSIS — D708 Other neutropenia: Secondary | ICD-10-CM | POA: Diagnosis not present

## 2021-07-14 DIAGNOSIS — B1921 Unspecified viral hepatitis C with hepatic coma: Secondary | ICD-10-CM | POA: Diagnosis not present

## 2021-07-14 DIAGNOSIS — R7303 Prediabetes: Secondary | ICD-10-CM | POA: Insufficient documentation

## 2021-07-14 DIAGNOSIS — E119 Type 2 diabetes mellitus without complications: Secondary | ICD-10-CM | POA: Diagnosis not present

## 2021-07-14 DIAGNOSIS — Z8679 Personal history of other diseases of the circulatory system: Secondary | ICD-10-CM | POA: Diagnosis not present

## 2021-07-14 DIAGNOSIS — E785 Hyperlipidemia, unspecified: Secondary | ICD-10-CM | POA: Diagnosis not present

## 2021-07-14 DIAGNOSIS — I10 Essential (primary) hypertension: Secondary | ICD-10-CM | POA: Insufficient documentation

## 2021-07-14 DIAGNOSIS — Z01818 Encounter for other preprocedural examination: Secondary | ICD-10-CM | POA: Insufficient documentation

## 2021-07-14 DIAGNOSIS — E7800 Pure hypercholesterolemia, unspecified: Secondary | ICD-10-CM | POA: Diagnosis not present

## 2021-07-14 LAB — CBC
HCT: 42.1 % (ref 39.0–52.0)
Hemoglobin: 14.2 g/dL (ref 13.0–17.0)
MCH: 30.3 pg (ref 26.0–34.0)
MCHC: 33.7 g/dL (ref 30.0–36.0)
MCV: 89.8 fL (ref 80.0–100.0)
Platelets: 259 10*3/uL (ref 150–400)
RBC: 4.69 MIL/uL (ref 4.22–5.81)
RDW: 12.1 % (ref 11.5–15.5)
WBC: 3.2 10*3/uL — ABNORMAL LOW (ref 4.0–10.5)
nRBC: 0 % (ref 0.0–0.2)

## 2021-07-14 LAB — COMPREHENSIVE METABOLIC PANEL
ALT: 24 U/L (ref 0–44)
AST: 25 U/L (ref 15–41)
Albumin: 4.2 g/dL (ref 3.5–5.0)
Alkaline Phosphatase: 58 U/L (ref 38–126)
Anion gap: 7 (ref 5–15)
BUN: 16 mg/dL (ref 8–23)
CO2: 25 mmol/L (ref 22–32)
Calcium: 9 mg/dL (ref 8.9–10.3)
Chloride: 104 mmol/L (ref 98–111)
Creatinine, Ser: 0.87 mg/dL (ref 0.61–1.24)
GFR, Estimated: >60 mL/min (ref >60)
Glucose, Bld: 103 mg/dL — ABNORMAL HIGH (ref 70–99)
Potassium: 3.9 mmol/L (ref 3.5–5.1)
Sodium: 136 mmol/L (ref 135–145)
Total Bilirubin: 0.6 mg/dL (ref 0.3–1.2)
Total Protein: 7.7 g/dL (ref 6.5–8.1)

## 2021-07-14 NOTE — Progress Notes (Signed)
Today's EKG reviewed by Dr. Wynetta Emery (anesthesia); no need for further cardiac workup prior to upcoming prostate biopsy.

## 2021-07-17 DIAGNOSIS — D709 Neutropenia, unspecified: Secondary | ICD-10-CM | POA: Diagnosis not present

## 2021-07-17 DIAGNOSIS — R7303 Prediabetes: Secondary | ICD-10-CM | POA: Diagnosis not present

## 2021-07-17 DIAGNOSIS — E7800 Pure hypercholesterolemia, unspecified: Secondary | ICD-10-CM | POA: Diagnosis not present

## 2021-07-17 DIAGNOSIS — Z6836 Body mass index (BMI) 36.0-36.9, adult: Secondary | ICD-10-CM | POA: Diagnosis not present

## 2021-07-17 DIAGNOSIS — I10 Essential (primary) hypertension: Secondary | ICD-10-CM | POA: Diagnosis not present

## 2021-07-20 MED ORDER — FLEET ENEMA 7-19 GM/118ML RE ENEM: 1.0000 | ENEMA | Freq: Once | RECTAL | Status: DC

## 2021-07-20 MED ORDER — SODIUM CHLORIDE 0.9 % IV SOLN
1.0000 g | INTRAVENOUS | Status: AC
  Administered 2021-07-21: 1 g via INTRAVENOUS
  Filled 2021-07-20: qty 1

## 2021-07-20 MED ORDER — ORAL CARE MOUTH RINSE: 15.0000 mL | Freq: Once | OROMUCOSAL | Status: AC

## 2021-07-20 MED ORDER — SODIUM CHLORIDE 0.9 % IV SOLN: INTRAVENOUS | Status: DC

## 2021-07-20 MED ORDER — FAMOTIDINE 20 MG PO TABS: 20.0000 mg | ORAL_TABLET | Freq: Once | ORAL | Status: AC

## 2021-07-20 MED ORDER — CHLORHEXIDINE GLUCONATE 0.12 % MT SOLN: 15.0000 mL | Freq: Once | OROMUCOSAL | Status: AC

## 2021-07-21 ENCOUNTER — Ambulatory Visit: Payer: Medicare HMO | Admitting: Anesthesiology

## 2021-07-21 ENCOUNTER — Ambulatory Visit
Admission: RE | Admit: 2021-07-21 | Discharge: 2021-07-21 | Disposition: A | Discharge status: Home / Self Care | Payer: Medicare HMO | Source: Ambulatory Visit | Attending: Urology | Admitting: Urology

## 2021-07-21 ENCOUNTER — Other Ambulatory Visit: Payer: Self-pay

## 2021-07-21 ENCOUNTER — Encounter
Admission: RE | Disposition: A | Discharge status: Home / Self Care | Payer: Self-pay | Source: Ambulatory Visit | Attending: Urology

## 2021-07-21 ENCOUNTER — Encounter: Payer: Self-pay | Admitting: Urology

## 2021-07-21 DIAGNOSIS — E7800 Pure hypercholesterolemia, unspecified: Secondary | ICD-10-CM | POA: Diagnosis not present

## 2021-07-21 DIAGNOSIS — I10 Essential (primary) hypertension: Secondary | ICD-10-CM | POA: Insufficient documentation

## 2021-07-21 DIAGNOSIS — Z6835 Body mass index (BMI) 35.0-35.9, adult: Secondary | ICD-10-CM | POA: Insufficient documentation

## 2021-07-21 DIAGNOSIS — Z79899 Other long term (current) drug therapy: Secondary | ICD-10-CM | POA: Diagnosis not present

## 2021-07-21 DIAGNOSIS — E669 Obesity, unspecified: Secondary | ICD-10-CM | POA: Insufficient documentation

## 2021-07-21 DIAGNOSIS — E119 Type 2 diabetes mellitus without complications: Secondary | ICD-10-CM | POA: Insufficient documentation

## 2021-07-21 DIAGNOSIS — R972 Elevated prostate specific antigen [PSA]: Secondary | ICD-10-CM

## 2021-07-21 DIAGNOSIS — C61 Malignant neoplasm of prostate: Secondary | ICD-10-CM

## 2021-07-21 HISTORY — PX: PROSTATE BIOPSY: SHX241

## 2021-07-21 HISTORY — PX: TRANSRECTAL ULTRASOUND: SHX5146

## 2021-07-21 LAB — GLUCOSE, CAPILLARY: Glucose-Capillary: 99 mg/dL (ref 70–99)

## 2021-07-21 SURGERY — ULTRASOUND, RECTAL APPROACH
Anesthesia: Monitor Anesthesia Care

## 2021-07-21 MED ORDER — OXYCODONE HCL 5 MG PO TABS: 5.0000 mg | ORAL_TABLET | Freq: Once | ORAL | Status: DC | PRN

## 2021-07-21 MED ORDER — OXYCODONE HCL 5 MG/5ML PO SOLN: 5.0000 mg | Freq: Once | ORAL | Status: DC | PRN

## 2021-07-21 MED ORDER — CHLORHEXIDINE GLUCONATE 0.12 % MT SOLN
OROMUCOSAL | Status: AC
  Administered 2021-07-21: 15 mL via OROMUCOSAL
  Filled 2021-07-21: qty 15

## 2021-07-21 MED ORDER — LIDOCAINE HCL (CARDIAC) PF 100 MG/5ML IV SOSY
PREFILLED_SYRINGE | INTRAVENOUS | Status: DC | PRN
  Administered 2021-07-21: 60 mg via INTRAVENOUS

## 2021-07-21 MED ORDER — PROPOFOL 500 MG/50ML IV EMUL
INTRAVENOUS | Status: DC | PRN
  Administered 2021-07-21: 20 mg via INTRAVENOUS
  Administered 2021-07-21: 150 ug/kg/min via INTRAVENOUS
  Administered 2021-07-21: 30 mg via INTRAVENOUS

## 2021-07-21 MED ORDER — ONDANSETRON HCL 4 MG/2ML IJ SOLN
INTRAMUSCULAR | Status: AC
  Filled 2021-07-21: qty 2

## 2021-07-21 MED ORDER — DROPERIDOL 2.5 MG/ML IJ SOLN
0.6250 mg | Freq: Once | INTRAMUSCULAR | Status: DC | PRN
  Filled 2021-07-21: qty 2

## 2021-07-21 MED ORDER — LIDOCAINE HCL (PF) 2 % IJ SOLN
INTRAMUSCULAR | Status: AC
  Filled 2021-07-21: qty 5

## 2021-07-21 MED ORDER — MIDAZOLAM HCL 2 MG/2ML IJ SOLN
INTRAMUSCULAR | Status: AC
  Filled 2021-07-21: qty 2

## 2021-07-21 MED ORDER — PHENYLEPHRINE HCL-NACL 20-0.9 MG/250ML-% IV SOLN
INTRAVENOUS | Status: AC
  Filled 2021-07-21: qty 250

## 2021-07-21 MED ORDER — PROPOFOL 500 MG/50ML IV EMUL
INTRAVENOUS | Status: AC
  Filled 2021-07-21: qty 50

## 2021-07-21 MED ORDER — SEVOFLURANE IN SOLN
RESPIRATORY_TRACT | Status: AC
  Filled 2021-07-21: qty 250

## 2021-07-21 MED ORDER — MIDAZOLAM HCL 2 MG/2ML IJ SOLN
INTRAMUSCULAR | Status: DC | PRN
  Administered 2021-07-21: 2 mg via INTRAVENOUS

## 2021-07-21 MED ORDER — ACETAMINOPHEN 10 MG/ML IV SOLN: 1000.0000 mg | Freq: Once | INTRAVENOUS | Status: DC | PRN

## 2021-07-21 MED ORDER — PROMETHAZINE HCL 25 MG/ML IJ SOLN: 6.2500 mg | INTRAMUSCULAR | Status: DC | PRN

## 2021-07-21 MED ORDER — PHENYLEPHRINE HCL (PRESSORS) 10 MG/ML IV SOLN
INTRAVENOUS | Status: AC
  Filled 2021-07-21: qty 1

## 2021-07-21 MED ORDER — FENTANYL CITRATE (PF) 100 MCG/2ML IJ SOLN
INTRAMUSCULAR | Status: AC
  Filled 2021-07-21: qty 2

## 2021-07-21 MED ORDER — DEXAMETHASONE SODIUM PHOSPHATE 10 MG/ML IJ SOLN
INTRAMUSCULAR | Status: AC
  Filled 2021-07-21: qty 1

## 2021-07-21 MED ORDER — FENTANYL CITRATE (PF) 100 MCG/2ML IJ SOLN: 25.0000 ug | INTRAMUSCULAR | Status: DC | PRN

## 2021-07-21 MED ORDER — FAMOTIDINE 20 MG PO TABS
ORAL_TABLET | ORAL | Status: AC
  Administered 2021-07-21: 20 mg via ORAL
  Filled 2021-07-21: qty 1

## 2021-07-21 MED ORDER — ONDANSETRON HCL 4 MG/2ML IJ SOLN
INTRAMUSCULAR | Status: DC | PRN
  Administered 2021-07-21: 4 mg via INTRAVENOUS

## 2021-07-21 SURGICAL SUPPLY — 8 items
COVER MAYO STAND REUSABLE (DRAPES) ×2 IMPLANT
GAUZE SPONGE 4X4 12PLY STRL (GAUZE/BANDAGES/DRESSINGS) ×2 IMPLANT
GLOVE SURG UNDER POLY LF SZ7.5 (GLOVE) ×2 IMPLANT
INST BIOPSY MAXCORE 18GX25 (NEEDLE) ×2 IMPLANT
KIT TURNOVER CYSTO (KITS) ×2 IMPLANT
MANIFOLD NEPTUNE II (INSTRUMENTS) ×2 IMPLANT
SURGILUBE 2OZ TUBE FLIPTOP (MISCELLANEOUS) ×2 IMPLANT
WATER STERILE IRR 500ML POUR (IV SOLUTION) ×2 IMPLANT

## 2021-07-21 NOTE — H&P (Signed)
Urology H&P  Urologic history:   1.  Elevated PSA Biopsy 11/17/2020 PSA 19.3; benign DRE 33 g prostate Benign pathology   HPI: 66 y.o. male presents for prostate biopsy   PSA November 2022 had increased to 48.3.  Repeat biopsy scheduled in office however he was unable to tolerate insertion of transrectal ultrasound probe and requested that procedure be performed under sedation  History of Present Illness: William Duffy is a 66 y.o.   Past Medical History:  Diagnosis Date   Acute lateral meniscus tear of right knee    Alcohol abuse    Colon adenomas    Diet-controlled diabetes mellitus ([place])    pre-diabetic   Diverticulosis    H/O drug abuse ([place])    H/O: gout    Hepatitis    hepatitis c   Hypercholesteremia    Hypertension    Internal hemorrhoids    Liver disease    Neutropenia ([place])    Obesity     Past Surgical History:  Procedure Laterality Date   CATARACT EXTRACTION W/PHACO Left 03/29/2019   Procedure: CATARACT EXTRACTION PHACO AND INTRAOCULAR LENS PLACEMENT ([place])  LEFT;  Surgeon: Marchia Meiers, MD;  Location: [facility] ORS;  Service: Ophthalmology;  Laterality: Left;  [place] 01:35.7 AP% 14.6 CDE 16.14 Fluid Pack Lot #[id number] H   COLONOSCOPY WITH PROPOFOL N/A 01/03/2015   Procedure: COLONOSCOPY WITH PROPOFOL;  Surgeon: Lollie Sails, MD;  Location[facility] ENDOSCOPY;  Service: Endoscopy;  Laterality: N/A;   COLONOSCOPY WITH PROPOFOL N/A 05/06/2015   Procedure: COLONOSCOPY WITH PROPOFOL;  Surgeon: Lollie Sails, MD;  Location: [facility] ENDOSCOPY;  Service: Endoscopy;  Laterality: N/A;   COLONOSCOPY WITH PROPOFOL N/A 06/06/2017   Procedure: COLONOSCOPY WITH PROPOFOL;  Surgeon: Lollie Sails, MD;  Location: [facility] ENDOSCOPY;  Service: Endoscopy;  Laterality: N/A;   KNEE ARTHROSCOPY Right    LIVER BIOPSY     MENISCUS REPAIR      Home Medications:  Current Meds  Medication Sig   diclofenac Sodium (VOLTAREN) 1 % GEL Apply 2 g topically daily as needed (pain).    indomethacin (INDOCIN) 50 MG capsule Take 50 mg by mouth 2 (two) times daily as needed (gout flare).    losartan (COZAAR) 25 MG tablet Take 25 mg by mouth at bedtime.   lovastatin (MEVACOR) 20 MG tablet Take 20 mg by mouth daily.    Allergies:  Allergies  Allergen Reactions   Cetirizine Hives and Swelling    Throat swells  Patient states he is not allergic to this and this never occurred 07/21/2021   Shrimp [Shellfish Allergy] Hives and Swelling    Throat swells    History reviewed. No pertinent family history.  Social History:  reports that he has never smoked. He has never used smokeless tobacco. He reports that he does not drink alcohol and does not use drugs.  ROS: A complete review of systems was performed.  All systems are negative except for pertinent findings as noted.  Physical Exam:  Vital signs in last 24 hours: Temp:  [96.9 F (36.1 C)] 96.9 F (36.1 C) (01/17 0628) Pulse Rate:  [65] 65 (01/17 0628) Resp:  [18] 18 (01/17 0628) SpO2:  [96 %] 96 % (01/17 0628) Weight:  [99.3 kg] 99.3 kg (01/17 0628) Constitutional:  Alert and oriented, No acute distress HEENT: [place] AT, moist mucus membranes.  Trachea midline, no masses Cardiovascular: Regular rate and rhythm, no clubbing, cyanosis, or edema. Respiratory: Normal respiratory effort, lungs clear bilaterally GI: Abdomen is soft,  nontender, nondistended, no abdominal masses GU: Increased firmness right prostate Skin: No rashes, bruises or suspicious lesions Lymph: No cervical or inguinal adenopathy Neurologic: Grossly intact, no focal deficits, moving all 4 extremities Psychiatric: Normal mood and affect   Laboratory Data:  No results for input(s): WBC, HGB, HCT in the last 72 hours. No results for input(s): NA, K, CL, CO2, GLUCOSE, BUN, CREATININE, CALCIUM in the last 72 hours. No results for input(s): LABPT, INR in the last 72 hours. No results for input(s): LABURIN in the last 72 hours. Results for orders placed  or performed in visit on 07/02/21  CULTURE, URINE COMPREHENSIVE     Status: None   Collection Time: 07/02/21  3:46 PM   Specimen: Urine   UR  Result Value Ref Range Status   Urine Culture, Comprehensive Final report  Final   Organism ID, Bacteria Comment  Final    Comment: No growth in 36 - 48 hours.    Impression/Assessment:  Elevated PSA  Plan:  Transrectal ultrasound/biopsy prostate.  The procedure has been discussed in detail including potential risks of infection/sepsis, bleeding.  All questions were answered and he desires to proceed  07/21/2021, 7:18 AM  John Giovanni,  MD

## 2021-07-21 NOTE — Op Note (Signed)
Preoperative diagnosis:  Elevated PSA  Postoperative diagnosis:  Same  Procedure: Transrectal ultrasound prostate Ultrasound-guided transrectal prostate biopsies  Surgeon: Abbie Sons, MD  Anesthesia: General  Complications: None  Intraoperative findings:  Prostate volume measured at 32 g.  No significant lateral lobe enlargement.  No significant hypoechoic abnormalities noted.  Seminal vesicles normal in appearance.  EBL: Minimal  Specimens: 12 core template biopsies  Indication: William Duffy is a 66 y.o. male with a previous negative biopsy May 2022 for PSA 19.3.  86-month follow-up PSA had increased to 48.  Repeat biopsy in office was attempted however adequate image of the prostate could not be obtained.  After reviewing the management options for treatment, he elected to proceed with the above surgical procedure(s). We have discussed the potential benefits and risks of the procedure, side effects of the proposed treatment, the likelihood of the patient achieving the goals of the procedure, and any potential problems that might occur during the procedure or recuperation. Informed consent has been obtained.  Description of procedure:  The patient was taken to the operating room.  He was not transferred to the OR table and was kept on the stretcher.  He was placed in the left lateral decubitus position and sedation was obtained by anesthesia.  A transrectal ultrasound probe with biopsy guide was lubricated and passed per rectum.  Transrectal ultrasound was performed with findings as described above.  Standard 12 core template biopsies were then obtained in the usual fashion.  No significant bleeding was noted.  The transrectal probe was removed and he was placed back in the supine position.  He was transported to the PACU in stable condition.  Plan: He will be contacted with the pathology results and further recommendations at that time   Abbie Sons, M.D.

## 2021-07-21 NOTE — Discharge Instructions (Addendum)
AMBULATORY SURGERY  DISCHARGE INSTRUCTIONS   The drugs that you were given will stay in your system until tomorrow so for the next 24 hours you should not:  Drive an automobile Make any legal decisions Drink any alcoholic beverage   You may resume regular meals tomorrow.  Today it is better to start with liquids and gradually work up to solid foods.  You may eat anything you prefer, but it is better to start with liquids, then soup and crackers, and gradually work up to solid foods.   Please notify your doctor immediately if you have any unusual bleeding, trouble breathing, redness and pain at the surgery site, drainage, fever, or pain not relieved by medication.    Additional Instructions:    Blood in the urine and from the rectum will be normal. Blood in the semen will persist for several weeks Call the office for excessive bleeding from the urinary tract or rectum; fever greater than 100 degrees or difficulty with urination You will be contacted with your pathology results and further recommendations at that time    Please contact your physician with any problems or Same Day Surgery at [phone], Monday through Friday 6 am to 4 pm, or [place] at [facility] number at [phone].   AMBULATORY SURGERY  DISCHARGE INSTRUCTIONS   The drugs that you were given will stay in your system until tomorrow so for the next 24 hours you should not:  Drive an automobile Make any legal decisions Drink any alcoholic beverage   You may resume regular meals tomorrow.  Today it is better to start with liquids and gradually work up to solid foods.  You may eat anything you prefer, but it is better to start with liquids, then soup and crackers, and gradually work up to solid foods.   Please notify your doctor immediately if you have any unusual bleeding, trouble breathing, redness and pain at the surgery site, drainage, fever, or pain not relieved by  medication.    Additional Instructions:        Please contact your physician with any problems or Same Day Surgery at [phone], Monday through Friday 6 am to 4 pm, or [place] at [facility] number at [phone].

## 2021-07-21 NOTE — Transfer of Care (Signed)
Immediate Anesthesia Transfer of Care Note  Patient: William Duffy  Procedure(s) Performed: TRANSRECTAL ULTRASOUND PROSTATE BIOPSY  Patient Location: PACU  Anesthesia Type:MAC  Level of Consciousness: drowsy  Airway & Oxygen Therapy: Patient Spontanous Breathing  Post-op Assessment: Report given to RN and Post -op Vital signs reviewed and stable  Post vital signs: Reviewed and stable  Last Vitals:  Vitals Value Taken Time  BP 105/64 07/21/21 0746  Temp    Pulse 69 07/21/21 0747  Resp 11 07/21/21 0747  SpO2 92 % 07/21/21 0747  Vitals shown include unvalidated device data.  Last Pain:  Vitals:   07/21/21 0628  TempSrc: Temporal  PainSc: 0-No pain         Complications: No notable events documented.

## 2021-07-21 NOTE — Anesthesia Preprocedure Evaluation (Addendum)
Anesthesia Evaluation  Patient identified by MRN, date of birth, ID band Patient awake    Reviewed: Allergy & Precautions, NPO status , Patient's Chart, lab work & pertinent test results  Airway Mallampati: II  TM Distance: >3 FB Neck ROM: Full    Dental  (+) Edentulous Upper   Pulmonary neg pulmonary ROS,    Pulmonary exam normal breath sounds clear to auscultation       Cardiovascular Exercise Tolerance: Good hypertension, Pt. on medications Normal cardiovascular exam Rhythm:Regular Rate:Normal     Neuro/Psych negative neurological ROS  negative psych ROS   GI/Hepatic negative GI ROS, Neg liver ROS,   Endo/Other  diabetes  Renal/GU negative Renal ROS  negative genitourinary   Musculoskeletal negative musculoskeletal ROS (+)   Abdominal (+) + obese,   Peds negative pediatric ROS (+)  Hematology negative hematology ROS (+)   Anesthesia Other Findings   Reproductive/Obstetrics negative OB ROS                            Anesthesia Physical Anesthesia Plan  ASA: 2  Anesthesia Plan: MAC   Post-op Pain Management:    Induction: Intravenous  PONV Risk Score and Plan: Propofol infusion and Ondansetron  Airway Management Planned: Natural Airway and Nasal Cannula  Additional Equipment:   Intra-op Plan:   Post-operative Plan:   Informed Consent: I have reviewed the patients History and Physical, chart, labs and discussed the procedure including the risks, benefits and alternatives for the proposed anesthesia with the patient or authorized representative who has indicated his/her understanding and acceptance.     Dental Advisory Given  Plan Discussed with: Anesthesiologist, CRNA and Surgeon  Anesthesia Plan Comments: (Patient consented for risks of anesthesia including but not limited to:  - adverse reactions to medications - damage to eyes, teeth, lips or other oral mucosa -  nerve damage due to positioning  - sore throat or hoarseness - Damage to heart, brain, nerves, lungs, other parts of body or loss of life  Patient voiced understanding.)        Anesthesia Quick Evaluation

## 2021-07-21 NOTE — Interval H&P Note (Signed)
History and Physical Interval Note:  07/21/2021 7:22 AM  William Duffy  has presented today for surgery, with the diagnosis of Prostate Biopsy with Ultrasound.  The various methods of treatment have been discussed with the patient and family. After consideration of risks, benefits and other options for treatment, the patient has consented to  Procedure(s): TRANSRECTAL ULTRASOUND (N/A) PROSTATE BIOPSY (N/A) as a surgical intervention.  The patient's history has been reviewed, patient examined, no change in status, stable for surgery.  I have reviewed the patient's chart and labs.  Questions were answered to the patient's satisfaction.     [place]

## 2021-07-22 LAB — SURGICAL PATHOLOGY

## 2021-07-22 NOTE — Anesthesia Postprocedure Evaluation (Signed)
Anesthesia Post Note  Patient: William Duffy  Procedure(s) Performed: TRANSRECTAL ULTRASOUND PROSTATE BIOPSY  Patient location during evaluation: PACU Anesthesia Type: MAC Level of consciousness: awake and alert Pain management: pain level controlled Vital Signs Assessment: post-procedure vital signs reviewed and stable Respiratory status: spontaneous breathing, nonlabored ventilation and respiratory function stable Cardiovascular status: blood pressure returned to baseline and stable Postop Assessment: no apparent nausea or vomiting Anesthetic complications: no   No notable events documented.   Last Vitals:  Vitals:   07/21/21 0816 07/21/21 0829  BP: 116/88 134/67  Pulse: (!) 58 62  Resp: 14 17  Temp: (!) 36.3 C (!) 36.1 C  SpO2: 96% 96%    Last Pain:  Vitals:   07/21/21 0829  TempSrc: Temporal  PainSc: 0-No pain                 [place]

## 2021-07-24 ENCOUNTER — Telehealth: Payer: Self-pay | Admitting: Urology

## 2021-07-24 DIAGNOSIS — C61 Malignant neoplasm of prostate: Secondary | ICD-10-CM

## 2021-07-24 NOTE — Telephone Encounter (Signed)
I contacted William Duffy regarding his prostate biopsy results.  He had no postbiopsy problems.  7/12 cores were positive for prostate cancer with all base cores showing extensive Gleason 5+4 adenocarcinoma (80-89% involvement).  The pathology report was discussed and have recommended proceeding with a prostate MRI and bone scan for staging.  If no evidence of metastatic disease we discussed potential treatments including radiation therapy and surgery.  All of his questions were answered.  He has a follow-up appointment scheduled in early February and will call earlier if the radiologic studies are done prior to that time.

## 2021-07-28 ENCOUNTER — Telehealth: Payer: Self-pay | Admitting: Urology

## 2021-07-28 NOTE — Telephone Encounter (Signed)
Error. Disregard

## 2021-08-04 ENCOUNTER — Other Ambulatory Visit: Payer: Self-pay

## 2021-08-04 ENCOUNTER — Encounter
Admission: RE | Admit: 2021-08-04 | Discharge: 2021-08-04 | Disposition: A | Discharge status: Home / Self Care | Payer: Medicare HMO | Source: Ambulatory Visit | Attending: Urology | Admitting: Urology

## 2021-08-04 ENCOUNTER — Ambulatory Visit
Admission: RE | Admit: 2021-08-04 | Discharge: 2021-08-04 | Disposition: A | Discharge status: Home / Self Care | Payer: Medicare HMO | Source: Ambulatory Visit | Attending: Urology | Admitting: Urology

## 2021-08-04 DIAGNOSIS — M160 Bilateral primary osteoarthritis of hip: Secondary | ICD-10-CM | POA: Diagnosis not present

## 2021-08-04 DIAGNOSIS — M19012 Primary osteoarthritis, left shoulder: Secondary | ICD-10-CM | POA: Diagnosis not present

## 2021-08-04 DIAGNOSIS — C61 Malignant neoplasm of prostate: Secondary | ICD-10-CM | POA: Insufficient documentation

## 2021-08-04 DIAGNOSIS — M170 Bilateral primary osteoarthritis of knee: Secondary | ICD-10-CM | POA: Diagnosis not present

## 2021-08-04 MED ORDER — TECHNETIUM TC 99M MEDRONATE IV KIT
22.7200 | PACK | Freq: Once | INTRAVENOUS | Status: AC | PRN
  Administered 2021-08-04: 22.72 via INTRAVENOUS

## 2021-08-06 ENCOUNTER — Ambulatory Visit: Payer: Medicare HMO

## 2021-08-06 ENCOUNTER — Ambulatory Visit: Payer: Medicare HMO | Admitting: Urology

## 2021-08-07 ENCOUNTER — Other Ambulatory Visit: Payer: Self-pay | Admitting: Urology

## 2021-08-07 DIAGNOSIS — C61 Malignant neoplasm of prostate: Secondary | ICD-10-CM

## 2021-08-11 ENCOUNTER — Ambulatory Visit: Payer: Medicare HMO

## 2021-08-11 ENCOUNTER — Other Ambulatory Visit: Payer: Medicare HMO

## 2021-08-13 ENCOUNTER — Ambulatory Visit: Payer: Medicare HMO | Admitting: Urology

## 2021-08-17 ENCOUNTER — Other Ambulatory Visit: Payer: Self-pay

## 2021-08-17 ENCOUNTER — Encounter
Admission: RE | Admit: 2021-08-17 | Discharge: 2021-08-17 | Disposition: A | Discharge status: Home / Self Care | Payer: Medicare HMO | Source: Ambulatory Visit | Attending: Urology | Admitting: Urology

## 2021-08-17 DIAGNOSIS — C61 Malignant neoplasm of prostate: Secondary | ICD-10-CM | POA: Diagnosis not present

## 2021-08-17 MED ORDER — FLUDEOXYGLUCOSE F - 18 (FDG) INJECTION
9.0000 | Freq: Once | INTRAVENOUS | Status: AC
  Administered 2021-08-17: 9.76 via INTRAVENOUS

## 2021-08-21 ENCOUNTER — Telehealth: Payer: Self-pay | Admitting: Urology

## 2021-08-21 DIAGNOSIS — R948 Abnormal results of function studies of other organs and systems: Secondary | ICD-10-CM

## 2021-08-21 DIAGNOSIS — C61 Malignant neoplasm of prostate: Secondary | ICD-10-CM

## 2021-08-21 NOTE — Telephone Encounter (Signed)
I contacted William Duffy to discuss his PSMA CT/PET results.  There was intense radiotracer activity in the entire prostate which extended into the base of the left and right seminal vesicle.  There was a small 4 mm right external iliac lymph node with mild radiotracer activity.  There was no evidence of skeletal metastasis.  Since he does have seminal vesicle involvement we discussed there is a possibility of microscopic extension beyond the prostate which would not be treated by surgery.  We also discussed radiation modalities in conjunction with ADT.  He has requested appointments with both radiation oncology and one of my partners who perform RALP.  Was incidentally noted to have a small thyroid hypodensity for which a thyroid ultrasound was recommended.  Order was placed.

## 2021-08-25 ENCOUNTER — Other Ambulatory Visit: Payer: Self-pay | Admitting: Urology

## 2021-08-25 ENCOUNTER — Other Ambulatory Visit: Payer: Self-pay

## 2021-08-25 ENCOUNTER — Ambulatory Visit: Payer: Medicare HMO | Admitting: Urology

## 2021-08-25 ENCOUNTER — Encounter: Payer: Self-pay | Admitting: Urology

## 2021-08-25 VITALS — BP 156/96 | HR 92 | Ht 66.0 in | Wt 224.0 lb

## 2021-08-25 DIAGNOSIS — C61 Malignant neoplasm of prostate: Secondary | ICD-10-CM | POA: Diagnosis not present

## 2021-08-25 DIAGNOSIS — N529 Male erectile dysfunction, unspecified: Secondary | ICD-10-CM | POA: Diagnosis not present

## 2021-08-25 NOTE — Progress Notes (Signed)
° °  08/25/2021 4:15 PM   William Duffy 03-Jan-1956 [id number]  Reason for visit: Discuss treatment options for high risk prostate cancer  HPI: 66 year old male with PSA of 51 recently diagnosed by Dr. Bernardo Heater with high risk prostate cancer(5+4 = 9).  Prostate measured 32 g.  Staging imaging with bone scan and PSMA PET scan showed no definite evidence of metastatic disease, however there was small subtle radiotracer avid right external iliac lymph node that is indeterminant.  At baseline he has ED that is refractory to medications, and he is not interested in other options for ED at this time.  He has mild urinary symptoms of urgency, frequency, nocturia 2 times at baseline.  He denies any prior abdominal surgeries.  We had a lengthy conversation today about the patient's new diagnosis of prostate cancer.  We reviewed the risk classifications per the AUA guidelines including very low risk, low risk, intermediate risk, and high risk disease, and the need for additional staging imaging with CT and bone scan in patients with unfavorable intermediate risk and high risk disease.  I explained that his life expectancy, clinical stage, Gleason score, PSA, and other co-morbidities influence treatment strategies.  We discussed the roles of active surveillance, radiation therapy, surgical therapy with robotic prostatectomy, and hormone therapy with androgen deprivation.  We discussed that patients urinary symptoms also impact treatment strategy, as patients with severe lower urinary tract symptoms may have significant worsening or even develop urinary retention after undergoing radiation.  In regards to surgery, we discussed robotic prostatectomy +/- lymphadenectomy at length.  The procedure takes 3 to 4 hours, and patient's typically discharge home on post-op day #1.  A Foley catheter is left in place for 7 to 10 days to allow for healing of the vesicourethral anastomosis.  There is a small risk of bleeding,  infection, damage to surrounding structures or bowel, hernia, DVT/PE, or serious cardiac or pulmonary complications.  We discussed at length post-op side effects including erectile dysfunction, and the importance of pre-operative erectile function on long-term outcomes.  Even with a nerve sparing approach, there is an approximately 25% rate of permanent erectile dysfunction.  We also discussed postop urinary incontinence at length.  We expect patients to have stress incontinence post-operatively that will improve over period of weeks to months.  Less than 10% of men will require a pad at 1 year after surgery.  Patients will need to avoid heavy lifting and strenuous activity for 3 to 4 weeks, but most men return to their baseline activity status by 6 weeks.  In summary, William Duffy is a 66 y.o. man with newly diagnosed high risk prostate cancer. He would like to pursue robotic prostatectomy, and understands with his significantly elevated PSA of 48 and high risk disease the high likelihood of need for adjuvant treatments with potentially ADT and radiation in the future.    I spent 45 total minutes on the day of the encounter including pre-visit review of the medical record, face-to-face time with the patient, and post visit ordering of labs/imaging/tests.   [place], [facility] [address], [place], [place] [address] [phone]

## 2021-08-25 NOTE — Patient Instructions (Signed)
Prostate Cancer The prostate is a small gland that produces fluid that makes up semen (seminal fluid). It is located below the bladder in men, in front of the rectum. Prostate cancer is the abnormal growth of cells in the prostate gland. What are the causes? The exact cause of this condition is not known. What increases the risk? You are more likely to develop this condition if: You are 66 years of age or older. You have a family history of prostate cancer. You have a family history of breast and ovarian cancer. You have genes that are passed from parent to child (inherited), such as BRCA1 and BRCA2. You have Lynch syndrome. African American men and men of African descent are diagnosed with prostate cancer at higher rates than other men. The reasons for this are not well understood and are likely due to a combination of genetic and environmental factors. What are the signs or symptoms? Symptoms of this condition include: Problems with urination. This may include: A weak or interrupted flow of urine. Trouble starting or stopping urination. Trouble emptying the bladder all the way. The need to urinate more often, especially at night. Blood in urine or semen. Persistent pain or discomfort in the lower back, lower abdomen, or hips. Trouble getting an erection. Weakness or numbness in the legs or feet. How is this diagnosed? This condition can be diagnosed with: A digital rectal exam. For this exam, a health care provider inserts a gloved finger into the rectum to feel the prostate gland. A blood test called a prostate-specific antigen (PSA) test. A procedure in which a sample of tissue is taken from the prostate and checked under a microscope (prostate biopsy). An imaging test called transrectal ultrasonography. Once the condition is diagnosed, tests will be done to determine how far the cancer has spread. This is called staging the cancer. Staging may involve imaging tests, such as a bone  scan, CT scan, PET scan, or MRI. Stages of prostate cancer The stages of prostate cancer are as follows: Stage 1 (I). At this stage, the cancer is found in the prostate only. The cancer is not visible on imaging tests, and it is usually found by accident, such as during prostate surgery. Stage 2 (II). At this stage, the cancer is more advanced than it is in stage 1, but the cancer has not spread outside the prostate. Stage 3 (III). At this stage, the cancer has spread beyond the outer layer of the prostate to nearby tissues. The cancer may be found in the seminal vesicles, which are near the bladder and the prostate. Stage 4 (IV). At this stage, the cancer has spread to other parts of the body, such as the lymph nodes, bones, bladder, rectum, liver, or lungs. Prostate cancer grading Prostate cancer is also graded according to how the cancer cells look under a microscope. This is called the Gleason score and the total score can range from 6-10, indicating how likely it is that the cancer will spread (metastasize) to other parts of the body. The higher the score, the greater the likelihood that the cancer will spread. Gleason 6 or lower: This indicates that the cancer cells look similar to normal prostate cells (well differentiated). Gleason 7: This indicates that the cancer cells look somewhat similar to normal prostate cells (moderately differentiated). Gleason 8, 9, or 10: This indicates that the cancer cells look very different than normal prostate cells (poorly differentiated). How is this treated? Treatment for this condition depends on several factors,  including the stage of the cancer, your age, personal preferences, and your overall health. Talk with your health care provider about treatment options that are recommended for you. Common treatments include: °Observation for early stage prostate cancer (active surveillance). This involves having exams, blood tests, and in some cases, more biopsies.  For some men, this is the only treatment needed. °Surgery. Types of surgeries include: °Open surgery (radical prostatectomy). In this surgery, a larger incision is made to remove the prostate. °A laparoscopic radical prostatectomy. This is a surgery to remove the prostate and lymph nodes through several small incisions. It is often referred to as a minimally invasive surgery. °A robotic radical prostatectomy. This is laparoscopic surgery to remove the prostate and lymph nodes with the help of robotic arms that are controlled by the surgeon. °Cryoablation. This is surgery to freeze and destroy cancer cells. °Radiation treatment. Types of radiation treatment include: °External beam radiation. This type aims beams of radiation from outside the body at the prostate to destroy cancerous cells. °Brachytherapy. This type uses radioactive needles, seeds, wires, or tubes that are implanted into the prostate gland. Like external beam radiation, brachytherapy destroys cancerous cells. An advantage is that this type of radiation limits the damage to surrounding tissue and has fewer side effects. °Chemotherapy. This treatment kills cancer cells or stops them from multiplying. It kills both cancer cells and normal cells. °Targeted therapy. This treatment uses medicines to kill cancer cells without damaging normal cells. °Hormone treatment. This treatment involves taking medicines that act on testosterone, one of the male hormones, by: °Stopping your body from producing testosterone. °Blocking testosterone from reaching cancer cells. °Follow these instructions at home: °Lifestyle °Do not use any products that contain nicotine or tobacco. These products include cigarettes, chewing tobacco, and vaping devices, such as e-cigarettes. If you need help quitting, ask your health care provider. °Eat a healthy diet. To do this: °Eat foods that are high in fiber. These include beans, whole grains, and fresh fruits and vegetables. °Limit  foods that are high in fat and sugar. These include fried or sweet foods. °Treatment for prostate cancer may affect sexual function. If you have a partner, continue to have intimate moments. This may include touching, holding, hugging, and caressing your partner. °Get plenty of sleep. °Consider joining a support group for men who have prostate cancer. Meeting with a support group may help you learn to manage the stress of having cancer. °General instructions °Take over-the-counter and prescription medicines only as told by your health care provider. °If you have to go to the hospital, notify your cancer specialist (oncologist). °Keep all follow-up visits. This is important. °Where to find more information °American Cancer Society: www.cancer.org °American Society of Clinical Oncology: www.cancer.net °National Cancer Institute: www.cancer.gov °Contact a health care provider if: °You have new or increasing trouble urinating. °You have new or increasing blood in your urine. °You have new or increasing pain in your hips, back, or chest. °Get help right away if: °You have weakness or numbness in your legs. °You cannot control urination or your bowel movements (incontinence). °You have chills or a fever. °Summary °The prostate is a small gland that is involved in the production of semen. It is located below a man's bladder, in front of the rectum. °Prostate cancer is the abnormal growth of cells in the prostate gland. °Treatment for this condition depends on the stage of the cancer, your age, personal preferences, and your overall health. Talk with your health care provider about treatment   options that are recommended for you. °Consider joining a support group for men who have prostate cancer. Meeting with a support group may help you learn to manage the stress of having cancer. °This information is not intended to replace advice given to you by your health care provider. Make sure you discuss any questions you have with  your health care provider. °Document Revised: 09/17/2020 Document Reviewed: 09/17/2020 °Elsevier Patient Education © 2022 [employer] Inc[employer] ° °Robot-Assisted Laparoscopic Radical Prostatectomy °Robot-assisted laparoscopic radical prostatectomy is surgery done to remove the entire prostate and nearby tissue. This includes the seminal vesicles, which are near the bladder and the prostate. This procedure is done to treat prostate cancer that has not spread (metastasized) to other parts of the body. The goal of the surgery is to remove all cancer cells to help keep the cancer from metastasizing. °During this procedure, the surgeon makes several incisions in the abdomen instead of one large incision. A long, thin, lighted tube with a tiny camera on the end (laparoscope) is put into one of the incisions. This allows the surgeon to see inside the abdomen. Other surgical tools are put in through the other incisions and used to take out the prostate and nearby tissues. The surgeon uses robotic arms to control these tools while sitting at a computer near the operating table. °Lymph nodes in the pelvis may also be removed. Lymph nodes are part of the body's disease-fighting system (immune system). When prostate cancer spreads, it tends to go to the lymph nodes in the pelvis first. If the pelvic lymph nodes are removed, they will be checked for cancer cells. °Tell a health care provider about: °Any allergies you have. °All medicines you are taking, including vitamins, herbs, eye drops, creams, and over-the-counter medicines. °Any problems you or family members have had with anesthetic medicines. °Any bleeding problems you have. °Any surgeries you have had. °Any medical conditions you have. °Any prostate infections you have had. °What are the risks? °Generally, this is a safe procedure. Still, problems may occur, including: °Infection. °Bleeding. °Allergic reactions to medicines. °Damage to nearby structures or organs, such as the  rectum, ureters, urethra, bladder, or small intestine. °Blockage (obstruction) of the large or small intestines. °Problems that affect urination or sexual function. These may include: °Narrowing or scarring of the urethra (stricture), which may block the flow of urine. °Inability to control when you urinate (incontinence). °Inability to get or keep an erection (erectile dysfunction). °Dry ejaculation. This is when no semen comes out during orgasm. °The formation of a sac (cyst) in the pelvis that is filled with fluid from the lymph glands (lymphocele). °Blood clots in the legs. °What happens before the procedure? °Staying hydrated °Follow instructions from your health care provider about hydration, which may include: °Up to 2 hours before the procedure - you may continue to drink clear liquids, such as water, clear fruit juice, black coffee, and plain tea. ° °Eating and drinking restrictions °Follow instructions from your health care provider about eating and drinking, which may include: °8 hours before the procedure - stop eating heavy meals or foods, such as meat, fried foods, or fatty foods. °6 hours before the procedure - stop eating light meals or foods, such as toast or cereal. °6 hours before the procedure - stop drinking milk or drinks that contain milk. °2 hours before the procedure - stop drinking clear liquids. °Medicines °Ask your health care provider about: °Changing or stopping your regular medicines. This is especially important if you   are taking diabetes medicines or blood thinners. °Taking medicines such as aspirin and ibuprofen. These medicines can thin your blood. Do not take these medicines unless your health care provider tells you to take them. °Taking over-the-counter medicines, vitamins, herbs, and supplements. °Follow your health care provider's instructions about cleaning out your bowels. °Surgery safety °Ask your health care provider: °How your surgery site will be marked. °What steps will  be taken to help prevent infection. These steps may include: °Removing hair at the surgery site. °Washing skin with a germ-killing soap. °Taking antibiotic medicine. °General instructions °Do not use any products that contain nicotine or tobacco for at least 4 weeks before the procedure. These products include cigarettes, chewing tobacco, and vaping devices, such as e-cigarettes. If you need help quitting, ask your health care provider. °Plan to have a responsible adult take you home from the hospital or clinic. °Plan to have a responsible adult care for you for the time you are told after you leave the hospital or clinic. °You may have an exam or testing. This may include blood or urine samples, or imaging tests such as a CT scan or an MRI. °What happens during the procedure? °An IV will be put into a vein in your hand or arm. °You may be given: °A medicine to help you relax (sedative). °A medicine to make you fall asleep (general anesthetic). °A thin, flexible tube (Foley catheter) will be put into your penis through your urethra and into your bladder to drain your urine. °Small incisions will be made in your abdomen and near your belly button. °The laparoscope and other surgical instruments will be put through the incisions. The surgical tools will be used to cut and remove your prostate, seminal vesicles, and maybe your pelvic lymph nodes. Your surgeon will use a computer and robotic arms to control the surgical instruments. °Your urethra will be cut and separated from your bladder to take out the prostate. Your urethra will then be reconnected to your bladder neck. This is the group of muscles that help push urine through your urethra. °A small tube (drain) may be put in one or more of your incisions to help drain extra fluid from your surgical site after surgery. °The laparoscope and other surgical instruments will be removed. °Your incisions will be closed with stitches (sutures), skin glue, or adhesive  strips. °Medicine may be applied and bandages (dressings) will be placed over your incisions. °The procedure may vary among health care providers and hospitals. °What happens after the procedure? °Your blood pressure, heart rate, breathing rate, and blood oxygen level will be monitored until you leave the hospital or clinic. °You may get fluids and medicines through your IV. You may be given antibiotics and medicines to help relieve pain or nausea. °You will be encouraged to walk as soon as possible. You will also use a device or do breathing exercises to keep your lungs clear. °The catheter will stay in to drain urine from your bladder. You will be taught how to care for it at home. °The drain may stay in to drain fluid from the surgical site. If so, you will be taught how to care for it at home. °You may need to wear compression stockings until you are able to get up and walk around. These stockings help prevent blood clots and reduce swelling in your legs. °If you were given a sedative during the procedure, it can affect you for several hours. Do not drive or operate machinery until   your health care provider says that it is safe. °Summary °Robot-assisted laparoscopic radical prostatectomy is a surgical procedure to remove the entire prostate and the seminal vesicles. °Follow instructions from your health care provider about eating and drinking before your surgery. °After your procedure, you may be given fluids and medicines through an IV. You may get antibiotics and medicines to help relieve pain or nausea. °After your surgery, you will continue to have a small, thin tube (Foley catheter) draining your urine. You will be taught how to care for it at home. °This information is not intended to replace advice given to you by your health care provider. Make sure you discuss any questions you have with your health care provider. °Document Revised: 09/17/2020 Document Reviewed: 09/17/2020 °Elsevier Patient Education ©  2022 Elsevier Inc. ° °

## 2021-08-25 NOTE — Progress Notes (Signed)
Surgical Physician Order Form [facility]   * Scheduling expectation : Next Available  *Length of Case: 4 hours  *Clearance needed: no  *Anticoagulation Instructions: Hold all anticoagulants  *Aspirin Instructions: Hold Aspirin  *Post-op visit Date/Instructions:  1 week cath removal and discuss pathology with MD  *Diagnosis: Prostate Cancer  *Procedure: bilateral Robotic laparoscopic Prostatectomy (15176) and lymph node dissection   Additional orders: N/A  -Admit type: Observation  -Anesthesia: General  -VTE Prophylaxis Standing Order SCDs   and heparin 5000 units SQH    Other:   -Standing Lab Orders Per Anesthesia    Lab other: PT/INR  -Standing Test orders EKG/Chest x-ray per Anesthesia       Test other:   - Medications:  Ancef 2gm IV  -Other orders:  N/A

## 2021-08-26 ENCOUNTER — Ambulatory Visit
Admission: RE | Admit: 2021-08-26 | Discharge: 2021-08-26 | Disposition: A | Discharge status: Home / Self Care | Payer: Medicare HMO | Source: Ambulatory Visit | Attending: Urology | Admitting: Urology

## 2021-08-26 DIAGNOSIS — R948 Abnormal results of function studies of other organs and systems: Secondary | ICD-10-CM | POA: Insufficient documentation

## 2021-08-26 DIAGNOSIS — E041 Nontoxic single thyroid nodule: Secondary | ICD-10-CM | POA: Diagnosis not present

## 2021-08-28 ENCOUNTER — Encounter: Payer: Self-pay | Admitting: Radiation Oncology

## 2021-08-28 ENCOUNTER — Ambulatory Visit
Admission: RE | Admit: 2021-08-28 | Discharge: 2021-08-28 | Disposition: A | Discharge status: Home / Self Care | Payer: Medicare HMO | Source: Ambulatory Visit | Attending: Radiation Oncology | Admitting: Radiation Oncology

## 2021-08-28 ENCOUNTER — Other Ambulatory Visit: Payer: Self-pay

## 2021-08-28 VITALS — BP 146/69 | HR 72 | Temp 97.5°F | Resp 16 | Wt 226.7 lb

## 2021-08-28 DIAGNOSIS — R9721 Rising PSA following treatment for malignant neoplasm of prostate: Secondary | ICD-10-CM | POA: Diagnosis not present

## 2021-08-28 DIAGNOSIS — C61 Malignant neoplasm of prostate: Secondary | ICD-10-CM | POA: Diagnosis not present

## 2021-08-28 DIAGNOSIS — E7800 Pure hypercholesterolemia, unspecified: Secondary | ICD-10-CM | POA: Insufficient documentation

## 2021-08-28 DIAGNOSIS — Z79899 Other long term (current) drug therapy: Secondary | ICD-10-CM | POA: Diagnosis not present

## 2021-08-28 DIAGNOSIS — I10 Essential (primary) hypertension: Secondary | ICD-10-CM | POA: Diagnosis not present

## 2021-08-28 DIAGNOSIS — E119 Type 2 diabetes mellitus without complications: Secondary | ICD-10-CM | POA: Diagnosis not present

## 2021-08-28 DIAGNOSIS — F1010 Alcohol abuse, uncomplicated: Secondary | ICD-10-CM | POA: Diagnosis not present

## 2021-08-28 HISTORY — DX: Malignant neoplasm of prostate: C61

## 2021-08-28 NOTE — Consult Note (Signed)
NEW PATIENT EVALUATION  Name: William Duffy  MRN: [id number]  Date:   08/28/2021     DOB: Nov 22, 1955   This 66 y.o. male patient presents to the clinic for initial evaluation of stage IIIc (cT2 aN0 M0) Gleason 9 (5+4) presenting with a PSA of 48.  REFERRING PHYSICIAN: Dion Body, MD  CHIEF COMPLAINT:  Chief Complaint  Patient presents with   Prostate Cancer    Initial consultation    DIAGNOSIS: The encounter diagnosis was Prostate cancer ([place]).   PREVIOUS INVESTIGATIONS:  PET CT scan and bone scan reviewed Clinical notes reviewed Pathology reports reviewed  HPI: Patient is a 66 year old male presented with an elevated PSA most recently up to 48.  He had undergone transrectal ultrasound-guided biopsy showing 7 of 12 cores positive for mostly Gleason 9 (5+4) adenocarcinoma.  He has had a PSM PET showing intense activity in the prostate gland consistent with adenocarcinoma.  There is also a single small radio avid right external iliac node indeterminate.  Bone scan was negative for metastatic disease.  He did have some focal uptake in the mid thoracic spine.  Patient does have nocturia x3 no urgency or frequency.  He is having no bowel problems he has no bone pain.  He has been scheduled for robotic assisted prostatectomy. PLANNED TREATMENT REGIMEN: Possible salvage radiation therapy after prostatectomy  PAST MEDICAL HISTORY:  has a past medical history of Acute lateral meniscus tear of right knee, Alcohol abuse, Colon adenomas, Diet-controlled diabetes mellitus ([place]), Diverticulosis, H/O drug abuse ([place]), H/O: gout, Hepatitis, Hypercholesteremia, Hypertension, Internal hemorrhoids, Liver disease, Neutropenia ([place]), Obesity, and Prostate cancer ([place]).    PAST SURGICAL HISTORY:  Past Surgical History:  Procedure Laterality Date   CATARACT EXTRACTION W/PHACO Left 03/29/2019   Procedure: CATARACT EXTRACTION PHACO AND INTRAOCULAR LENS PLACEMENT ([place])  LEFT;  Surgeon: Marchia Meiers, MD;  Location: [facility] ORS;  Service: Ophthalmology;  Laterality: Left;  [place] 01:35.7 AP% 14.6 CDE 16.14 Fluid Pack Lot #[id number]   COLONOSCOPY WITH PROPOFOL N/A 01/03/2015   Procedure: COLONOSCOPY WITH PROPOFOL;  Surgeon: Lollie Sails, MD;  Location: [facility] ENDOSCOPY;  Service: Endoscopy;  Laterality: N/A;   COLONOSCOPY WITH PROPOFOL N/A 05/06/2015   Procedure: COLONOSCOPY WITH PROPOFOL;  Surgeon: Lollie Sails, MD;  Location: [facility] ENDOSCOPY;  Service: Endoscopy;  Laterality: N/A;   COLONOSCOPY WITH PROPOFOL N/A 06/06/2017   Procedure: COLONOSCOPY WITH PROPOFOL;  Surgeon: Lollie Sails, MD;  Location: [facility] ENDOSCOPY;  Service: Endoscopy;  Laterality: N/A;   KNEE ARTHROSCOPY Right    LIVER BIOPSY     MENISCUS REPAIR     PROSTATE BIOPSY N/A 07/21/2021   Procedure: PROSTATE BIOPSY;  Surgeon: Abbie Sons, MD;  Location: [facility] ORS;  Service: Urology;  Laterality: N/A;   TRANSRECTAL ULTRASOUND N/A 07/21/2021   Procedure: TRANSRECTAL ULTRASOUND;  Surgeon: Abbie Sons, MD;  Location: [facility] ORS;  Service: Urology;  Laterality: N/A;    FAMILY HISTORY: family history is not on file.  SOCIAL HISTORY:  reports that he has never smoked. He has never used smokeless tobacco. He reports that he does not drink alcohol and does not use drugs.  ALLERGIES: Cetirizine and Shrimp [shellfish allergy]  MEDICATIONS:  Current Outpatient Medications  Medication Sig Dispense Refill   diclofenac Sodium (VOLTAREN) 1 % GEL Apply topically.     indomethacin (INDOCIN) 50 MG capsule Take 50 mg by mouth 2 (two) times daily as needed (gout flare).      losartan (COZAAR) 25 MG tablet Take 25  mg by mouth at bedtime.     lovastatin (MEVACOR) 40 MG tablet Take 1 tablet by mouth at bedtime.     No current facility-administered medications for this encounter.    ECOG PERFORMANCE STATUS:  0 - Asymptomatic  REVIEW OF SYSTEMS: Patient denies any weight loss, fatigue, weakness, fever, chills or night  sweats. Patient denies any loss of vision, blurred vision. Patient denies any ringing  of the ears or hearing loss. No irregular heartbeat. Patient denies heart murmur or history of fainting. Patient denies any chest pain or pain radiating to her upper extremities. Patient denies any shortness of breath, difficulty breathing at night, cough or hemoptysis. Patient denies any swelling in the lower legs. Patient denies any nausea vomiting, vomiting of blood, or coffee ground material in the vomitus. Patient denies any stomach pain. Patient states has had normal bowel movements no significant constipation or diarrhea. Patient denies any dysuria, hematuria or significant nocturia. Patient denies any problems walking, swelling in the joints or loss of balance. Patient denies any skin changes, loss of hair or loss of weight. Patient denies any excessive worrying or anxiety or significant depression. Patient denies any problems with insomnia. Patient denies excessive thirst, polyuria, polydipsia. Patient denies any swollen glands, patient denies easy bruising or easy bleeding. Patient denies any recent infections, allergies or URI. Patient "s visual fields have not changed significantly in recent time.   PHYSICAL EXAM: BP (!) 146/69 (BP Location: Left Arm, Patient Position: Sitting)    Pulse 72    Temp (!) 97.5 F (36.4 C) (Tympanic)    Resp 16    Wt 226 lb 11.2 oz (102.8 kg)    BMI 36.59 kg/m  Well-developed well-nourished patient in NAD. HEENT reveals PERLA, EOMI, discs not visualized.  Oral cavity is clear. No oral mucosal lesions are identified. Neck is clear without evidence of cervical or supraclavicular adenopathy. Lungs are clear to A&P. Cardiac examination is essentially unremarkable with regular rate and rhythm without murmur rub or thrill. Abdomen is benign with no organomegaly or masses noted. Motor sensory and DTR levels are equal and symmetric in the upper and lower extremities. Cranial nerves II through  XII are grossly intact. Proprioception is intact. No peripheral adenopathy or edema is identified. No motor or sensory levels are noted. Crude visual fields are within normal range.  LABORATORY DATA: Pathology report reviewed    RADIOLOGY RESULTS: PET scan and bone scan both reviewed compatible with above-stated findings   IMPRESSION: Stage IIIc Gleason 9 open adenocarcinoma the prostate presenting with a PSA in the 50 range and 66 year old male  PLAN: Patient is in excellent overall general condition.  He is scheduled for robotic prostatectomy next month.  I discussed the risk and benefits of possible salvage radiation therapy should he have biochemical relapse after surgery.  He does have a high likelihood of possible extracapsular spread as well as possible pelvic lymph node involvement.  Risks and benefits of radiation in a salvage mode were discussed with the patient.  He comprehends my recommendations well.  I would like to take this opportunity to thank you for allowing me to participate in the care of your patient.Noreene Filbert, MD

## 2021-08-30 ENCOUNTER — Other Ambulatory Visit: Payer: Self-pay | Admitting: Urology

## 2021-08-30 ENCOUNTER — Encounter: Payer: Self-pay | Admitting: Urology

## 2021-08-30 DIAGNOSIS — E041 Nontoxic single thyroid nodule: Secondary | ICD-10-CM

## 2021-09-03 DIAGNOSIS — E041 Nontoxic single thyroid nodule: Secondary | ICD-10-CM | POA: Diagnosis not present

## 2021-09-04 ENCOUNTER — Telehealth: Payer: Self-pay

## 2021-09-04 NOTE — Telephone Encounter (Signed)
I spoke with Mr. William Duffy. We have discussed possible surgery dates and Monday April 10th, 2023 was agreed upon by all parties. Patient given information about surgery date, what to expect pre-operatively and post operatively.  ? ?We discussed that a Pre-Admission Testing office will be calling to set up the pre-op visit that will take place prior to surgery, and that these appointments are typically done over the phone with a Pre-Admissions RN. ? ? Informed patient that our office will communicate any additional care to be provided after surgery. Patients questions or concerns were discussed during our call. Advised to call our office should there be any additional information, questions or concerns that arise. Patient verbalized understanding.  ? ?

## 2021-09-04 NOTE — Progress Notes (Signed)
[facility] Posting Form   Surgery Date/Time: Date: 10/12/2021  Surgeon: Dr. Nickolas Madrid, MD  Surgery Location: Day Surgery  Inpt ( No  )   Outpt (No)   Obs ( Yes  )   Diagnosis: C61 Prostate Cancer  -CPT: 12248, [id number]  Surgery: Robotic Laparoscopic Radical Prostatectomy and bilateral pelvic lymph node dissection   Stop Anticoagulations: Yes, also hold ASA  Cardiac/Medical/Pulmonary Clearance needed: no  *Orders entered into EPIC  Date: 09/04/21   *Case booked in [place]  Date: 08/28/2021  *Notified pt of Surgery: Date: 08/28/2021  PRE-OP UA & CX: no  *Placed into Prior Authorization Work Que Date: 09/04/21   Assistant/laser/rep:No

## 2021-09-07 ENCOUNTER — Other Ambulatory Visit: Payer: Self-pay | Admitting: Unknown Physician Specialty

## 2021-09-07 DIAGNOSIS — E041 Nontoxic single thyroid nodule: Secondary | ICD-10-CM

## 2021-09-08 ENCOUNTER — Telehealth: Payer: Self-pay

## 2021-09-08 NOTE — Telephone Encounter (Signed)
Updated letter sent to patient via mychart.

## 2021-09-09 ENCOUNTER — Other Ambulatory Visit: Payer: Self-pay

## 2021-09-09 ENCOUNTER — Other Ambulatory Visit
Admission: RE | Admit: 2021-09-09 | Discharge: 2021-09-09 | Disposition: A | Discharge status: Home / Self Care | Payer: Medicare HMO | Source: Ambulatory Visit | Attending: Urology | Admitting: Urology

## 2021-09-09 DIAGNOSIS — C61 Malignant neoplasm of prostate: Secondary | ICD-10-CM | POA: Insufficient documentation

## 2021-09-09 DIAGNOSIS — Z01812 Encounter for preprocedural laboratory examination: Secondary | ICD-10-CM

## 2021-09-09 DIAGNOSIS — Z20822 Contact with and (suspected) exposure to covid-19: Secondary | ICD-10-CM | POA: Insufficient documentation

## 2021-09-09 NOTE — Patient Instructions (Addendum)
Your procedure is scheduled on: 09/14/21 - Monday ?Report to the Registration Desk on the 1st floor of the [facility]. ?To find out your arrival time, please call [phone] between 1PM - 3PM on: 09/11/21 - Friday ?Report to [facility] for Covid Test on 09/10/21 between 8 am to 12 noon. ? ?REMEMBER: ?Instructions that are not followed completely may result in serious medical risk, up to and including death; or upon the discretion of your surgeon and anesthesiologist your surgery may need to be rescheduled. ? ?Do not eat food or drink any fluids after midnight the night before surgery.  ?No gum chewing, lozengers or hard candies. ? ?TAKE THESE MEDICATIONS THE MORNING OF SURGERY WITH A SIP OF WATER: NONE ? ?Follow recommendations from Cardiologist, Pulmonologist or PCP regarding stopping Aspirin, Coumadin, Plavix, Eliquis, Pradaxa, or Pletal. Stop Aspirin 81 mg beginning 09/09/21. ? ?One week prior to surgery: ?Stop Anti-inflammatories (NSAIDS) such as Advil, Aleve, Ibuprofen, Motrin, Naproxen, Naprosyn and Aspirin based products such as Excedrin, Goodys Powder, BC Powder. ? ?Stop ANY OVER THE COUNTER supplements until after surgery. ? ?You may take Tylenol if needed for pain up until the day of surgery. ? ?No Alcohol for 24 hours before or after surgery. ? ?No Smoking including e-cigarettes for 24 hours prior to surgery.  ?No chewable tobacco products for at least 6 hours prior to surgery.  ?No nicotine patches on the day of surgery. ? ?Do not use any "recreational" drugs for at least a week prior to your surgery.  ?Please be advised that the combination of cocaine and anesthesia may have negative outcomes, up to and including death. ?If you test positive for cocaine, your surgery will be cancelled. ? ?On the morning of surgery brush your teeth with toothpaste and water, you may rinse your mouth with mouthwash if you wish. ?Do not swallow any toothpaste or mouthwash. ? ?Do not wear jewelry, make-up,  hairpins, clips or nail polish. ? ?Do not wear lotions, powders, or perfumes.  ? ?Do not shave body from the neck down 48 hours prior to surgery just in case you cut yourself which could leave a site for infection.  ?Also, freshly shaved skin may become irritated if using the CHG soap. ? ?Contact lenses, hearing aids and dentures may not be worn into surgery. ? ?Do not bring valuables to the hospital. [facility] is not responsible for any missing/lost belongings or valuables.  ? ?Notify your doctor if there is any change in your medical condition (cold, fever, infection). ? ?Wear comfortable clothing (specific to your surgery type) to the hospital. ? ?After surgery, you can help prevent lung complications by doing breathing exercises.  ?Take deep breaths and cough every 1-2 hours. Your doctor may order a device called an Incentive Spirometer to help you take deep breaths. ?When coughing or sneezing, hold a pillow firmly against your incision with both hands. This is called ?splinting.? Doing this helps protect your incision. It also decreases belly discomfort. ? ?If you are being admitted to the hospital overnight, leave your suitcase in the car. ?After surgery it may be brought to your room. ? ?If you are being discharged the day of surgery, you will not be allowed to drive home. ?You will need a responsible adult (18 years or older) to drive you home and stay with you that night.  ? ?If you are taking public transportation, you will need to have a responsible adult (18 years or older) with you. ?Please confirm with  your physician that it is acceptable to use public transportation.  ? ?Please call the [facility] at [phone] if you have any questions about these instructions. ? ?Surgery Visitation Policy: ? ?Patients undergoing a surgery or procedure may have one family member or support person with them as long as that person is not COVID-19 positive or experiencing its symptoms.  ?That  person may remain in the waiting area during the procedure and may rotate out with other people. ? ?Inpatient Visitation:   ? ?Visiting hours are 7 a.m. to 8 p.m. ?Up to two visitors ages 16+ are allowed at one time in a patient room. The visitors may rotate out with other people during the day. Visitors must check out when they leave, or other visitors will not be allowed. One designated support person may remain overnight. ?The visitor must pass COVID-19 screenings, use hand sanitizer when entering and exiting the patient?s room and wear a mask at all times, including in the patient?s room. ?Patients must also wear a mask when staff or their visitor are in the room. ?Masking is required regardless of vaccination status.  ?

## 2021-09-10 ENCOUNTER — Encounter: Payer: Self-pay | Admitting: Urgent Care

## 2021-09-10 ENCOUNTER — Other Ambulatory Visit: Payer: Medicare HMO

## 2021-09-10 ENCOUNTER — Other Ambulatory Visit
Admission: RE | Admit: 2021-09-10 | Discharge: 2021-09-10 | Disposition: A | Discharge status: Home / Self Care | Payer: Medicare HMO | Source: Ambulatory Visit | Attending: Urology | Admitting: Urology

## 2021-09-10 DIAGNOSIS — Z01812 Encounter for preprocedural laboratory examination: Secondary | ICD-10-CM | POA: Diagnosis not present

## 2021-09-10 DIAGNOSIS — C61 Malignant neoplasm of prostate: Secondary | ICD-10-CM

## 2021-09-10 DIAGNOSIS — Z20822 Contact with and (suspected) exposure to covid-19: Secondary | ICD-10-CM

## 2021-09-10 LAB — PROTIME-INR
INR: 1.1 (ref 0.8–1.2)
Prothrombin Time: 14 seconds (ref 11.4–15.2)

## 2021-09-10 LAB — TYPE AND SCREEN
ABO/RH(D): O POS
Antibody Screen: NEGATIVE

## 2021-09-10 LAB — SARS CORONAVIRUS 2 (TAT 6-24 HRS): SARS Coronavirus 2: NEGATIVE

## 2021-09-14 ENCOUNTER — Encounter
Admission: RE | Disposition: A | Discharge status: Home / Self Care | Payer: Medicare HMO | Source: Ambulatory Visit | Attending: Urology

## 2021-09-14 ENCOUNTER — Other Ambulatory Visit: Payer: Self-pay

## 2021-09-14 ENCOUNTER — Ambulatory Visit: Payer: Medicare HMO | Admitting: Anesthesiology

## 2021-09-14 ENCOUNTER — Observation Stay
Admission: RE | Admit: 2021-09-14 | Discharge: 2021-09-15 | Disposition: A | Discharge status: Home / Self Care | Payer: Medicare HMO | Source: Ambulatory Visit | Attending: Urology | Admitting: Urology

## 2021-09-14 ENCOUNTER — Encounter: Payer: Self-pay | Admitting: Urology

## 2021-09-14 DIAGNOSIS — Z7982 Long term (current) use of aspirin: Secondary | ICD-10-CM | POA: Insufficient documentation

## 2021-09-14 DIAGNOSIS — C61 Malignant neoplasm of prostate: Principal | ICD-10-CM | POA: Diagnosis present

## 2021-09-14 DIAGNOSIS — I10 Essential (primary) hypertension: Secondary | ICD-10-CM | POA: Insufficient documentation

## 2021-09-14 DIAGNOSIS — Z79899 Other long term (current) drug therapy: Secondary | ICD-10-CM | POA: Diagnosis not present

## 2021-09-14 HISTORY — PX: ROBOT ASSISTED LAPAROSCOPIC RADICAL PROSTATECTOMY: SHX5141

## 2021-09-14 HISTORY — PX: PELVIC LYMPH NODE DISSECTION: SHX6543

## 2021-09-14 LAB — GLUCOSE, CAPILLARY
Glucose-Capillary: 117 mg/dL — ABNORMAL HIGH (ref 70–99)
Glucose-Capillary: 137 mg/dL — ABNORMAL HIGH (ref 70–99)

## 2021-09-14 LAB — ABO/RH: ABO/RH(D): O POS

## 2021-09-14 SURGERY — PROSTATECTOMY, RADICAL, ROBOT-ASSISTED, LAPAROSCOPIC
Anesthesia: General

## 2021-09-14 MED ORDER — ACETAMINOPHEN 10 MG/ML IV SOLN
INTRAVENOUS | Status: AC
  Filled 2021-09-14: qty 100

## 2021-09-14 MED ORDER — SODIUM CHLORIDE 0.45 % IV SOLN: INTRAVENOUS | Status: DC

## 2021-09-14 MED ORDER — ONDANSETRON HCL 4 MG/2ML IJ SOLN: 4.0000 mg | Freq: Once | INTRAMUSCULAR | Status: DC | PRN

## 2021-09-14 MED ORDER — LOSARTAN POTASSIUM 25 MG PO TABS
25.0000 mg | ORAL_TABLET | Freq: Every day | ORAL | Status: DC
  Administered 2021-09-15: 25 mg via ORAL
  Filled 2021-09-14: qty 1

## 2021-09-14 MED ORDER — 0.9 % SODIUM CHLORIDE (POUR BTL) OPTIME
TOPICAL | Status: DC | PRN
  Administered 2021-09-14: 250 mL

## 2021-09-14 MED ORDER — BUPIVACAINE HCL (PF) 0.5 % IJ SOLN
INTRAMUSCULAR | Status: AC
  Filled 2021-09-14: qty 30

## 2021-09-14 MED ORDER — LIDOCAINE HCL (CARDIAC) PF 100 MG/5ML IV SOSY
PREFILLED_SYRINGE | INTRAVENOUS | Status: DC | PRN
  Administered 2021-09-14: 100 mg via INTRAVENOUS

## 2021-09-14 MED ORDER — ORAL CARE MOUTH RINSE: 15.0000 mL | Freq: Once | OROMUCOSAL | Status: AC

## 2021-09-14 MED ORDER — ACETAMINOPHEN 500 MG PO TABS
1000.0000 mg | ORAL_TABLET | Freq: Four times a day (QID) | ORAL | Status: DC
  Administered 2021-09-14 – 2021-09-15 (×3): 1000 mg via ORAL
  Filled 2021-09-14 (×3): qty 2

## 2021-09-14 MED ORDER — HEPARIN SODIUM (PORCINE) 5000 UNIT/ML IJ SOLN: 5000.0000 [IU] | Freq: Once | INTRAMUSCULAR | Status: AC

## 2021-09-14 MED ORDER — SUGAMMADEX SODIUM 200 MG/2ML IV SOLN
INTRAVENOUS | Status: DC | PRN
  Administered 2021-09-14: 200 mg via INTRAVENOUS

## 2021-09-14 MED ORDER — EPHEDRINE SULFATE (PRESSORS) 50 MG/ML IJ SOLN
INTRAMUSCULAR | Status: DC | PRN
  Administered 2021-09-14: 2.5 mg via INTRAVENOUS
  Administered 2021-09-14: 10 mg via INTRAVENOUS
  Administered 2021-09-14: 2.5 mg via INTRAVENOUS
  Administered 2021-09-14: 10 mg via INTRAVENOUS

## 2021-09-14 MED ORDER — ROCURONIUM BROMIDE 100 MG/10ML IV SOLN
INTRAVENOUS | Status: DC | PRN
  Administered 2021-09-14: 20 mg via INTRAVENOUS
  Administered 2021-09-14: 60 mg via INTRAVENOUS
  Administered 2021-09-14: 20 mg via INTRAVENOUS

## 2021-09-14 MED ORDER — CHLORHEXIDINE GLUCONATE 0.12 % MT SOLN: 15.0000 mL | Freq: Once | OROMUCOSAL | Status: AC

## 2021-09-14 MED ORDER — LACTATED RINGERS IV SOLN
INTRAVENOUS | Status: DC
Start: 2021-09-14 — End: 2021-09-14

## 2021-09-14 MED ORDER — HEPARIN SODIUM (PORCINE) 5000 UNIT/ML IJ SOLN
5000.0000 [IU] | Freq: Three times a day (TID) | INTRAMUSCULAR | Status: DC
  Administered 2021-09-15: 5000 [IU] via SUBCUTANEOUS
  Filled 2021-09-14: qty 1

## 2021-09-14 MED ORDER — DEXMEDETOMIDINE (PRECEDEX) IN NS 20 MCG/5ML (4 MCG/ML) IV SYRINGE
PREFILLED_SYRINGE | INTRAVENOUS | Status: DC | PRN
  Administered 2021-09-14: 8 ug via INTRAVENOUS
  Administered 2021-09-14 (×3): 4 ug via INTRAVENOUS

## 2021-09-14 MED ORDER — DEXAMETHASONE SODIUM PHOSPHATE 10 MG/ML IJ SOLN
INTRAMUSCULAR | Status: DC | PRN
  Administered 2021-09-14: 10 mg via INTRAVENOUS

## 2021-09-14 MED ORDER — MIDAZOLAM HCL 2 MG/2ML IJ SOLN
INTRAMUSCULAR | Status: AC
  Filled 2021-09-14: qty 2

## 2021-09-14 MED ORDER — CEFAZOLIN SODIUM-DEXTROSE 2-4 GM/100ML-% IV SOLN
INTRAVENOUS | Status: AC
  Filled 2021-09-14: qty 100

## 2021-09-14 MED ORDER — ACETAMINOPHEN 10 MG/ML IV SOLN
INTRAVENOUS | Status: DC | PRN
  Administered 2021-09-14: 1000 mg via INTRAVENOUS

## 2021-09-14 MED ORDER — CHLORHEXIDINE GLUCONATE 0.12 % MT SOLN
OROMUCOSAL | Status: AC
  Administered 2021-09-14: 15 mL via OROMUCOSAL
  Filled 2021-09-14: qty 15

## 2021-09-14 MED ORDER — SURGIFLO WITH THROMBIN (HEMOSTATIC MATRIX KIT) OPTIME
TOPICAL | Status: DC | PRN
Start: 2021-09-14 — End: 2021-09-14
  Administered 2021-09-14: 1 via TOPICAL

## 2021-09-14 MED ORDER — HYDROCODONE-ACETAMINOPHEN 5-325 MG PO TABS
1.0000 | ORAL_TABLET | ORAL | Status: DC | PRN
  Administered 2021-09-14: 1 via ORAL
  Filled 2021-09-14: qty 1

## 2021-09-14 MED ORDER — ONDANSETRON HCL 4 MG/2ML IJ SOLN
INTRAMUSCULAR | Status: DC | PRN
  Administered 2021-09-14: 4 mg via INTRAVENOUS

## 2021-09-14 MED ORDER — PROPOFOL 10 MG/ML IV BOLUS
INTRAVENOUS | Status: DC | PRN
  Administered 2021-09-14: 170 mg via INTRAVENOUS

## 2021-09-14 MED ORDER — CEFAZOLIN SODIUM-DEXTROSE 2-4 GM/100ML-% IV SOLN
2.0000 g | INTRAVENOUS | Status: AC
  Administered 2021-09-14: 2 g via INTRAVENOUS

## 2021-09-14 MED ORDER — FAMOTIDINE 20 MG PO TABS
ORAL_TABLET | ORAL | Status: AC
Start: 2021-09-14 — End: 2021-09-14
  Administered 2021-09-14: 20 mg
  Filled 2021-09-14: qty 1

## 2021-09-14 MED ORDER — FENTANYL CITRATE (PF) 100 MCG/2ML IJ SOLN
25.0000 ug | INTRAMUSCULAR | Status: AC | PRN
  Administered 2021-09-14 (×6): 25 ug via INTRAVENOUS

## 2021-09-14 MED ORDER — FENTANYL CITRATE (PF) 100 MCG/2ML IJ SOLN
INTRAMUSCULAR | Status: AC
  Administered 2021-09-14: 25 ug via INTRAVENOUS
  Filled 2021-09-14: qty 2

## 2021-09-14 MED ORDER — HEPARIN SODIUM (PORCINE) 5000 UNIT/ML IJ SOLN
INTRAMUSCULAR | Status: AC
Start: 2021-09-14 — End: 2021-09-14
  Administered 2021-09-14: 5000 [IU] via SUBCUTANEOUS
  Filled 2021-09-14: qty 1

## 2021-09-14 MED ORDER — ONDANSETRON HCL 4 MG/2ML IJ SOLN: 4.0000 mg | INTRAMUSCULAR | Status: DC | PRN

## 2021-09-14 MED ORDER — DOCUSATE SODIUM 100 MG PO CAPS
100.0000 mg | ORAL_CAPSULE | Freq: Two times a day (BID) | ORAL | Status: DC
  Administered 2021-09-14 – 2021-09-15 (×2): 100 mg via ORAL
  Filled 2021-09-14 (×2): qty 1

## 2021-09-14 MED ORDER — PRAVASTATIN SODIUM 20 MG PO TABS: 40.0000 mg | ORAL_TABLET | Freq: Every day | ORAL | Status: DC

## 2021-09-14 MED ORDER — PHENYLEPHRINE HCL (PRESSORS) 10 MG/ML IV SOLN
INTRAVENOUS | Status: DC | PRN
  Administered 2021-09-14 (×2): 80 ug via INTRAVENOUS
  Administered 2021-09-14: 160 ug via INTRAVENOUS
  Administered 2021-09-14 (×2): 80 ug via INTRAVENOUS
  Administered 2021-09-14: 160 ug via INTRAVENOUS

## 2021-09-14 MED ORDER — HYDROMORPHONE HCL 1 MG/ML IJ SOLN: 0.5000 mg | INTRAMUSCULAR | Status: DC | PRN

## 2021-09-14 MED ORDER — FENTANYL CITRATE (PF) 100 MCG/2ML IJ SOLN
INTRAMUSCULAR | Status: AC
  Filled 2021-09-14: qty 2

## 2021-09-14 MED ORDER — BUPIVACAINE HCL 0.5 % IJ SOLN
INTRAMUSCULAR | Status: DC | PRN
  Administered 2021-09-14: 30 mL

## 2021-09-14 MED ORDER — MIDAZOLAM HCL 2 MG/2ML IJ SOLN
INTRAMUSCULAR | Status: DC | PRN
  Administered 2021-09-14: 2 mg via INTRAVENOUS

## 2021-09-14 MED ORDER — FENTANYL CITRATE (PF) 100 MCG/2ML IJ SOLN
INTRAMUSCULAR | Status: DC | PRN
  Administered 2021-09-14: 25 ug via INTRAVENOUS
  Administered 2021-09-14: 50 ug via INTRAVENOUS
  Administered 2021-09-14: 25 ug via INTRAVENOUS

## 2021-09-14 SURGICAL SUPPLY — 88 items
ANCHOR TIS RET SYS 235ML (MISCELLANEOUS) ×3 IMPLANT
APPLICATOR SURGIFLO ENDO (HEMOSTASIS) ×3 IMPLANT
BAG INFUSER PRESSURE 100CC (MISCELLANEOUS) IMPLANT
BAG URINE DRAIN 2000ML AR STRL (UROLOGICAL SUPPLIES) ×3 IMPLANT
BLADE CLIPPER SURG (BLADE) ×3 IMPLANT
BULB RESERV EVAC DRAIN JP 100C (MISCELLANEOUS) IMPLANT
CATH FOL 2WAY LX 18X5 (CATHETERS) ×4 IMPLANT
CATH FOL 2WAY LX 20X5 (CATHETERS) IMPLANT
CHLORAPREP W/TINT 26 (MISCELLANEOUS) ×4 IMPLANT
CLIP LIGATING HEM O LOK PURPLE (MISCELLANEOUS) ×9 IMPLANT
COVER TIP SHEARS 8 DVNC (MISCELLANEOUS) ×2 IMPLANT
COVER TIP SHEARS 8MM DA VINCI (MISCELLANEOUS) ×1
DERMABOND ADVANCED (GAUZE/BANDAGES/DRESSINGS) ×1
DERMABOND ADVANCED .7 DNX12 (GAUZE/BANDAGES/DRESSINGS) ×2 IMPLANT
DRAIN CHANNEL JP 15F RND 16 (MISCELLANEOUS) IMPLANT
DRAIN CHANNEL JP 19F (MISCELLANEOUS) IMPLANT
DRAPE 3/4 80X56 (DRAPES) ×3 IMPLANT
DRAPE ARM DVNC X/XI (DISPOSABLE) ×8 IMPLANT
DRAPE COLUMN DVNC XI (DISPOSABLE) ×2 IMPLANT
DRAPE DA VINCI XI ARM (DISPOSABLE) ×4
DRAPE DA VINCI XI COLUMN (DISPOSABLE) ×1
DRAPE LEGGINS SURG 28X43 STRL (DRAPES) ×3 IMPLANT
DRAPE SURG 17X11 SM STRL (DRAPES) ×12 IMPLANT
DRAPE UNDER BUTTOCK W/FLU (DRAPES) ×3 IMPLANT
DRSG TELFA 3X8 NADH (GAUZE/BANDAGES/DRESSINGS) ×3 IMPLANT
ELECT REM PT RETURN 9FT ADLT (ELECTROSURGICAL) ×3
ELECTRODE REM PT RTRN 9FT ADLT (ELECTROSURGICAL) ×2 IMPLANT
GLOVE SURG ENC MOIS LTX SZ6.5 (GLOVE) ×1 IMPLANT
GLOVE SURG UNDER POLY LF SZ7.5 (GLOVE) ×9 IMPLANT
GOWN STRL REUS W/ TWL LRG LVL3 (GOWN DISPOSABLE) ×4 IMPLANT
GOWN STRL REUS W/ TWL XL LVL3 (GOWN DISPOSABLE) ×4 IMPLANT
GOWN STRL REUS W/TWL LRG LVL3 (GOWN DISPOSABLE) ×4
GOWN STRL REUS W/TWL XL LVL3 (GOWN DISPOSABLE) ×2
GRASPER SUT TROCAR 14GX15 (MISCELLANEOUS) ×3 IMPLANT
HEMOSTAT SURGICEL 2X14 (HEMOSTASIS) ×1 IMPLANT
HOLDER FOLEY CATH W/STRAP (MISCELLANEOUS) ×3 IMPLANT
IRRIGATION STRYKERFLOW (MISCELLANEOUS) ×2 IMPLANT
IRRIGATOR STRYKERFLOW (MISCELLANEOUS) ×3
IV NS 1000ML (IV SOLUTION) ×1
IV NS 1000ML BAXH (IV SOLUTION) ×2 IMPLANT
KIT PINK PAD W/HEAD ARE REST (MISCELLANEOUS) ×3
KIT PINK PAD W/HEAD ARM REST (MISCELLANEOUS) ×2 IMPLANT
LABEL OR SOLS (LABEL) ×3 IMPLANT
MANIFOLD NEPTUNE II (INSTRUMENTS) ×3 IMPLANT
NDL INSUFFLATION 14GA 120MM (NEEDLE) ×2 IMPLANT
NEEDLE HYPO 22GX1.5 SAFETY (NEEDLE) ×3 IMPLANT
NEEDLE INSUFFLATION 14GA 120MM (NEEDLE) ×3 IMPLANT
NS IRRIG 500ML POUR BTL (IV SOLUTION) ×3 IMPLANT
OBTURATOR OPTICAL STANDARD 8MM (TROCAR) ×1
OBTURATOR OPTICAL STND 8 DVNC (TROCAR) ×2
OBTURATOR OPTICALSTD 8 DVNC (TROCAR) ×2 IMPLANT
PACK LAP CHOLECYSTECTOMY (MISCELLANEOUS) ×3 IMPLANT
PAD DRESSING TELFA 3X8 NADH (GAUZE/BANDAGES/DRESSINGS) ×2 IMPLANT
PENCIL ELECTRO HAND CTR (MISCELLANEOUS) ×3 IMPLANT
RELOAD STAPLE 60 2.6 WHT THN (STAPLE) IMPLANT
RELOAD STAPLER WHITE 60MM (STAPLE) ×2 IMPLANT
SEAL CANN UNIV 5-8 DVNC XI (MISCELLANEOUS) ×8 IMPLANT
SEAL XI 5MM-8MM UNIVERSAL (MISCELLANEOUS) ×4
SET TUBE SMOKE EVAC HIGH FLOW (TUBING) ×3 IMPLANT
SOLUTION ELECTROLUBE (MISCELLANEOUS) ×3 IMPLANT
SPONGE T-LAP 4X18 ~~LOC~~+RFID (SPONGE) ×3 IMPLANT
SPONGE VERSALON 4X4 4PLY (MISCELLANEOUS) IMPLANT
STAPLE ECHEON FLEX 60 POW ENDO (STAPLE) ×1 IMPLANT
STAPLER RELOAD WHITE 60MM (STAPLE) ×3
STAPLER SKIN PROX 35W (STAPLE) ×3 IMPLANT
STRAP SAFETY 5IN WIDE (MISCELLANEOUS) ×2 IMPLANT
SURGIFLO W/THROMBIN 8M KIT (HEMOSTASIS) ×3 IMPLANT
SURGILUBE 2OZ TUBE FLIPTOP (MISCELLANEOUS) ×3 IMPLANT
SUT DVC VLOC 90 3-0 CV23 UNDY (SUTURE) ×6 IMPLANT
SUT DVC VLOC 90 3-0 CV23 VLT (SUTURE) ×3
SUT ETHILON 3-0 FS-10 30 BLK (SUTURE)
SUT MNCRL 4-0 (SUTURE) ×2
SUT MNCRL 4-0 27XMFL (SUTURE) ×4
SUT PROLENE 5 0 RB 1 DA (SUTURE) IMPLANT
SUT SILK 2 0 SH (SUTURE) IMPLANT
SUT VIC AB 0 CT1 36 (SUTURE) ×5 IMPLANT
SUT VIC AB 2-0 CT1 (SUTURE) IMPLANT
SUT VICRYL 0 AB UR-6 (SUTURE) ×3 IMPLANT
SUTURE DVC VLC 90 3-0 CV23 VLT (SUTURE) ×2 IMPLANT
SUTURE EHLN 3-0 FS-10 30 BLK (SUTURE) IMPLANT
SUTURE MNCRL 4-0 27XMF (SUTURE) ×4 IMPLANT
SYR TOOMEY IRRIG 70ML (MISCELLANEOUS) ×3
SYRINGE TOOMEY IRRIG 70ML (MISCELLANEOUS) ×2 IMPLANT
TAPE CLOTH 3X10 WHT NS LF (GAUZE/BANDAGES/DRESSINGS) ×5 IMPLANT
TROCAR XCEL 12X100 BLDLESS (ENDOMECHANICALS) ×3 IMPLANT
TROCAR XCEL NON-BLD 5MMX100MML (ENDOMECHANICALS) ×3 IMPLANT
WATER STERILE IRR 3000ML UROMA (IV SOLUTION) ×3 IMPLANT
WATER STERILE IRR 500ML POUR (IV SOLUTION) ×3 IMPLANT

## 2021-09-14 NOTE — Op Note (Signed)
Date of procedure: 09/14/2021 ?  ?Preoperative diagnosis:  ?High risk prostate cancer ?  ?Postoperative diagnosis:  ?Same ?  ?Procedure: ?Da Vinci robotic radical prostatectomy ?Bilateral pelvic lymph node dissection ?  ?Surgeon: Nickolas Madrid, MD ?  ?Assistant: Hollice Espy, MD ? ?(An assistant was required for this surgical procedure.  The duties of the assistant included but were not limited to suctioning, passing suture, camera manipulation, retraction. This procedure would not be able to be performed without an assistant) ?  ?Anesthesia: General ?  ?Complications: None ?  ?Intraoperative findings:  ?1.  Robotic prostatectomy and pelvic LN dissection, concern for local extension posteriorly at the seminal vesicles ?2.  Watertight anastomosis ?  ?EBL: 250 cc ?  ?Specimens:  ?1.  Prostate and seminal vesicles ?2. Right pelvic lymph nodes ?3. Left pelvic lymph nodes ?  ?Drains: 20 [place] 2-way Foley ?  ?Indication: William Duffy is a 66 y.o. male with PSA 48 and high risk prostate cancer with no definite evidence of metastatic disease with PSMA scan.  We discussed treatment options including surgery or radiation, and he elected for robotic prostatectomy.  After reviewing the management options for treatment, they elected to proceed with the above surgical procedure(s). We have discussed the potential benefits and risks of the procedure, side effects of the proposed treatment, the likelihood of the patient achieving the goals of the procedure, and any potential problems that might occur during the procedure or recuperation. Informed consent has been obtained. We specifically discussed the risks of bleeding, infection, bowel injury, injury to surrounding structures, incontinence, erectile dysfunction, urine leak, possible need for prolonged foley or drain placement, need for long term PSA monitoring, risk of recurrence of disease, and possible need for salvage radiation in the future. ?  ?Description of  procedure: ?  ?The patient was taken to the operating room and general anesthesia was induced. SCDs were placed for DVT prophylaxis, and 5000 units subcutaneous heparin were given.  The patient was placed in the dorsal lithotomy position, carefully padded with the arms against the sides, prepped and draped in the usual sterile fashion, and preoperative antibiotics(Ancef) were administered. A preoperative time-out was performed.  ?  ?On the field, an 65 [place] Foley catheter passed easily into the bladder with return of clear yellow urine.  An 8 mm incision was made above the umbilicus, and a Veress needle was used to obtain access to the peritoneum.  The abdomen was insufflated to 15 mmHg, and the robotic ports were placed in standard fashion under direct vision. A Carter-Thomason device was used to pre-place an 0-vicryl suture in the right lateral 3m assistant port under direct vision. Thorough inspection of the abdomen showed no bowel injury or other abnormal findings.  Lidocaine was injected into all port sites prior to placement.  The patient was then placed in steep Trendelenburg position and the robot was docked. ?  ?I started by entering the space of Retzius and taking down the bladder.  Once this was free to the vas deferens bilaterally, the periprostatic fat was cleaned off the prostate and sent for permanent.  The endopelvic fascia was opened bilaterally, and muscle fibers spared laterally.  The periprostatic ligaments were carefully taken down.  The DVC was isolated, and ligated using a 60 mm load vascular stapler.  ?  ?I then turned my attention to the anterior bladder neck, and this was incised down to the level of the Foley catheter.  The Foley catheter was removed from the bladder and  gently held upward for traction.  The posterior bladder neck was carefully divided, and the seminal vesicles were socked in with no clear anatomic plane, and concern for local extension of prostate cancer.  At this  point I addressed the pedicles using Weck clips to get better exposure.  A nerve spare was not performed with his high risk disease.  Despite multiple angles, the seminal vesicles could not be cleanly dissected free.  At this point I moved to the apex of the prostate and the prostate was retracted posteriorly and I dissected down to the urethra.  Cautery was minimized at the urethra.The urethra was sharply entered and the Foley catheter removed.  The posterior urethral plate was divided.  I then worked back towards the seminal vesicles in the posterior plane, and was ultimately able to retract the prostate up into the abdomen and chisel the bases of the seminal vesicle free using cautery.  There was concern for obvious local extension of prostate cancer.  There was no evidence of bowel injury. ? ?The specimen was placed in an Endo Catch bag and moved out of the field.  There was good hemostasis in the pelvis. ?  ?A lymph node dissection was then performed. I started on the right side and removed the lymphatic tissue in the obturator fossa with the borders of the external iliac vein laterally, node of [place] distally, the bladder medially, and the obturator nerve. An identical procedure was performed on the left side. Weck clips were used to clip visualized lymphatics. ?  ?A 3-0 V lock stitch was used to reapproximate the tissue behind the bladder and posteriorly to the urethra as described by Rocco in order to remove tension from the anastomosis.  Two 3-0 V lock stitches were connected and used for the urethrovesical anastomosis.  A running anastomosis was performed circumferentially and there appeared to be good approximation of the mucosa on both sides.  A new 9 French Foley passed easily into the bladder. With the sutures tied anteriorly and 12cc in the balloon, 150 cc normal saline were instilled with no leakage noted in the operative field.  Anterior suspension was performed to the pubic bone.  There was good  hemostasis.  A small piece of Surgicel was placed on either side of the anastomosis along the endopelvic fascia in addition to Surgi-Flo, as well as in the obturator fossa bilaterally. All instruments were removed and the robot was undocked. ?  ?The supraumbilical incision was extended superiorly, and the specimen was removed.  The pre-placed fascial suture in the 55m right lateral assistant port was tied down. The fascia of the small midline incision was closed using a running 0 Vicryl.  All port sites were copiously irrigated.  There was good hemostasis.  A running 4-0 Monocryl was used to close all incisions, and Dermabond was applied. ?  ?All counts were correct, and the patient was awakened and taken to PACU in stable condition. ?  ?Disposition: Stable to PACU ?  ?Plan: ?Anticipate discharge home tomorrow with catheter in place for 1 week ? ?BNickolas Madrid MD ?09/14/2021 ? ?

## 2021-09-14 NOTE — Transfer of Care (Signed)
Immediate Anesthesia Transfer of Care Note ? ?Patient: William Duffy ? ?Procedure(s) Performed: XI ROBOTIC ASSISTED LAPAROSCOPIC RADICAL PROSTATECTOMY ?PELVIC LYMPH NODE DISSECTION (Bilateral) ? ?Patient Location: PACU ? ?Anesthesia Type:General ? ?Level of Consciousness: awake, alert  and oriented ? ?Airway & Oxygen Therapy: Patient Spontanous Breathing and Patient connected to face mask oxygen ? ?Post-op Assessment: Report given to RN and Post -op Vital signs reviewed and stable ? ?Post vital signs: Reviewed and stable ? ?Last Vitals:  ?Vitals Value Taken Time  ?BP/ 104/55   ?Temp    ?Pulse 85 09/14/21 1551  ?Resp 15   ?SpO2 95 % 09/14/21 1551  ?Vitals shown include unvalidated device data. ? ?Last Pain:  ?Vitals:  ? 09/14/21 1036  ?TempSrc: Oral  ?PainSc: 0-No pain  ?   ? ?  ? ?Complications: No notable events documented. ?

## 2021-09-14 NOTE — Anesthesia Preprocedure Evaluation (Signed)
Anesthesia Evaluation  ?Patient identified by MRN, date of birth, ID band ?Patient awake ? ? ? ?Reviewed: ?Allergy & Precautions, NPO status , Patient's Chart, lab work & pertinent test results ? ?Airway ?Mallampati: III ? ?TM Distance: >3 FB ?Neck ROM: Full ? ? ? Dental ? ?(+) Partial Upper ?  ?Pulmonary ?neg pulmonary ROS,  ?  ?Pulmonary exam normal ?breath sounds clear to auscultation ? ? ? ? ? ? Cardiovascular ?Exercise Tolerance: Good ?hypertension, Pt. on medications ?negative cardio ROS ?Normal cardiovascular exam ?Rhythm:Regular Rate:Normal ? ? ?  ?Neuro/Psych ?negative neurological ROS ? negative psych ROS  ? GI/Hepatic ?negative GI ROS, Neg liver ROS, (+) Hepatitis -, C  ?Endo/Other  ?negative endocrine ROSdiabetes ? Renal/GU ?  ?negative genitourinary ?  ?Musculoskeletal ?negative musculoskeletal ROS ?(+)  ? Abdominal ?Normal abdominal exam  (+)   ?Peds ?negative pediatric ROS ?(+)  Hematology ?negative hematology ROS ?(+)   ?Anesthesia Other Findings ?Past Medical History: ?No date: Acute lateral meniscus tear of right knee ?No date: Alcohol abuse ?No date: Colon adenomas ?No date: Diet-controlled diabetes mellitus ([place]) ?    Comment:  pre-diabetic ?No date: Diverticulosis ?No date: H/O drug abuse ([place]) ?No date: H/O: gout ?No date: Hepatitis ?    Comment:  hepatitis c ?No date: Hypercholesteremia ?No date: Hypertension ?No date: Internal hemorrhoids ?No date: Liver disease ?No date: Neutropenia ([place]) ?No date: Obesity ?No date: Prostate cancer [facility]) ? ?Past Surgical History: ?03/29/2019: CATARACT EXTRACTION W/PHACO; Left ?    Comment:  Procedure: CATARACT EXTRACTION PHACO AND INTRAOCULAR  ?             LENS PLACEMENT ([place])  LEFT;  Surgeon: Marchia Meiers, MD;  ?             Location: [facility] ORS;  Service: Ophthalmology;  Laterality: ?             Left;  [place] 01:35.7 ?AP% 14.6 ?CDE 16.14 ?Fluid Pack Lot  ?             #[id number] ?01/03/2015: COLONOSCOPY WITH PROPOFOL; N/A ?     Comment:  Procedure: COLONOSCOPY WITH PROPOFOL;  Surgeon: Billie Ruddy ?             Gustavo Lah, MD;  Location: [facility] ENDOSCOPY;  Service:  ?             Endoscopy;  Laterality: N/A; ?05/06/2015: COLONOSCOPY WITH PROPOFOL; N/A ?    Comment:  Procedure: COLONOSCOPY WITH PROPOFOL;  Surgeon: Billie Ruddy ?             Gustavo Lah, MD;  Location: [facility] ENDOSCOPY;  Service:  ?             Endoscopy;  Laterality: N/A; ?06/06/2017: COLONOSCOPY WITH PROPOFOL; N/A ?    Comment:  Procedure: COLONOSCOPY WITH PROPOFOL;  Surgeon:  ?             Lollie Sails, MD;  Location: [facility] ENDOSCOPY;   ?             Service: Endoscopy;  Laterality: N/A; ?No date: KNEE ARTHROSCOPY; Right ?No date: LIVER BIOPSY ?No date: MENISCUS REPAIR ?07/21/2021: PROSTATE BIOPSY; N/A ?    Comment:  Procedure: PROSTATE BIOPSY;  Surgeon: Abbie Sons,  ?             MD;  Location: [facility] ORS;  Service: Urology;  Laterality:  ?             N/A; ?07/21/2021: TRANSRECTAL ULTRASOUND; N/A ?  Comment:  Procedure: TRANSRECTAL ULTRASOUND;  Surgeon: Bernardo Heater,  ?             Ronda Fairly, MD;  Location: [facility] ORS;  Service: Urology;   ?             Laterality: N/A; ? ?BMI   ? Body Mass Index: 36.32 kg/m?  ?  ? ? Reproductive/Obstetrics ?negative OB ROS ? ?  ? ? ? ? ? ? ? ? ? ? ? ? ? ?  ?  ? ? ? ? ? ? ? ? ?Anesthesia Physical ?Anesthesia Plan ? ?ASA: 3 ? ?Anesthesia Plan: General  ? ?Post-op Pain Management:   ? ?Induction: Intravenous ? ?PONV Risk Score and Plan: Ondansetron, Dexamethasone, Midazolam and Treatment may vary due to age or medical condition ? ?Airway Management Planned: Oral ETT ? ?Additional Equipment:  ? ?Intra-op Plan:  ? ?Post-operative Plan: Extubation in OR ? ?Informed Consent: I have reviewed the patients History and Physical, chart, labs and discussed the procedure including the risks, benefits and alternatives for the proposed anesthesia with the patient or authorized representative who has indicated his/her understanding and acceptance.  ? ? ? ?Dental Advisory  Given ? ?Plan Discussed with: CRNA and Surgeon ? ?Anesthesia Plan Comments:   ? ? ? ? ? ? ?Anesthesia Quick Evaluation ? ?

## 2021-09-14 NOTE — Anesthesia Procedure Notes (Signed)
Procedure Name: Intubation ?Date/Time: 09/14/2021 12:26 PM ?Performed by: Aline Brochure, CRNA ?Pre-anesthesia Checklist: Patient identified, Patient being monitored, Timeout performed, Emergency Drugs available and Suction available ?Patient Re-evaluated:Patient Re-evaluated prior to induction ?Oxygen Delivery Method: Circle system utilized ?Preoxygenation: Pre-oxygenation with 100% oxygen ?Induction Type: IV induction ?Ventilation: Mask ventilation without difficulty ?Laryngoscope Size: McGraph and 4 ?Grade View: Grade I ?Tube type: Oral ?Tube size: 7.5 mm ?Number of attempts: 1 ?Airway Equipment and Method: Stylet and Video-laryngoscopy ?Placement Confirmation: ETT inserted through vocal cords under direct vision, positive ETCO2 and breath sounds checked- equal and bilateral ?Secured at: 23 cm ?Tube secured with: Tape ?Dental Injury: Teeth and Oropharynx as per pre-operative assessment  ? ? ? ? ?

## 2021-09-14 NOTE — H&P (Signed)
? ?09/14/21 ?11:51 AM  ? ?William Duffy ?1956-03-19 ?[id number] ? ?CC: high risk prostate cancer ? ?HPI: ?66 year old male with PSA of 82 recently diagnosed by Dr. Bernardo Heater with high risk prostate cancer(5+4 = 9).  Prostate measured 32 g.  Staging imaging with bone scan and PSMA PET scan showed no definite evidence of metastatic disease, however there was small subtle radiotracer avid right external iliac lymph node that is indeterminant. ?  ?At baseline he has ED that is refractory to medications, and he is not interested in other options for ED at this time.  He has mild urinary symptoms of urgency, frequency, nocturia 2 times at baseline.  He denies any prior abdominal surgeries. ? ? ?PMH: ?Past Medical History:  ?Diagnosis Date  ? Acute lateral meniscus tear of right knee   ? Alcohol abuse   ? Colon adenomas   ? Diet-controlled diabetes mellitus ([place])   ? pre-diabetic  ? Diverticulosis   ? H/O drug abuse ([place])   ? H/O: gout   ? Hepatitis   ? hepatitis c  ? Hypercholesteremia   ? Hypertension   ? Internal hemorrhoids   ? Liver disease   ? Neutropenia ([place])   ? Obesity   ? Prostate cancer ([place])   ? ? ?Surgical History: ?Past Surgical History:  ?Procedure Laterality Date  ? CATARACT EXTRACTION W/PHACO Left 03/29/2019  ? Procedure: CATARACT EXTRACTION PHACO AND INTRAOCULAR LENS PLACEMENT ([place])  LEFT;  Surgeon: Marchia Meiers, MD;  Location: [facility] ORS;  Service: Ophthalmology;  Laterality: Left;  [place] 01:35.7 ?AP% 14.6 ?CDE 16.14 ?Fluid Pack Lot #[id number]  ? COLONOSCOPY WITH PROPOFOL N/A 01/03/2015  ? Procedure: COLONOSCOPY WITH PROPOFOL;  Surgeon: Lollie Sails, MD;  Location: [facility] ENDOSCOPY;  Service: Endoscopy;  Laterality: N/A;  ? COLONOSCOPY WITH PROPOFOL N/A 05/06/2015  ? Procedure: COLONOSCOPY WITH PROPOFOL;  Surgeon: Lollie Sails, MD;  [facility] ENDOSCOPY;  Service: Endoscopy;  Laterality: N/A;  ? COLONOSCOPY WITH PROPOFOL N/A 06/06/2017  ? Procedure: COLONOSCOPY WITH PROPOFOL;  Surgeon: Lollie Sails, MD;  Location: [facility] ENDOSCOPY;  Service: Endoscopy;  Laterality: N/A;  ? KNEE ARTHROSCOPY Right   ? LIVER BIOPSY    ? MENISCUS REPAIR    ? PROSTATE BIOPSY N/A 07/21/2021  ? Procedure: PROSTATE BIOPSY;  Surgeon: Abbie Sons, MD;  Location: [facility] ORS;  Service: Urology;  Laterality: N/A;  ? TRANSRECTAL ULTRASOUND N/A 07/21/2021  ? Procedure: TRANSRECTAL ULTRASOUND;  Surgeon: Abbie Sons, MD;  Location: [facility] ORS;  Service: Urology;  Laterality: N/A;  ? ? ? ?Family History: ?History reviewed. No pertinent family history. ? ?Social History:  reports that he has never smoked. He has never used smokeless tobacco. He reports that he does not drink alcohol and does not use drugs. ? ?Physical Exam: ?BP (!) 150/96   Pulse 70   Temp 97.9 ?F (36.6 ?C) (Oral)   Resp 18   Ht '5\' 6"'$  (1.676 m)   Wt 102.1 kg   SpO2 96%   BMI 36.32 kg/m?   ? ?Constitutional:  Alert and oriented, No acute distress. ?Cardiovascular: RRR ?Respiratory: CTA bilaterally ?GI: Abdomen is soft, nontender, nondistended, no abdominal masses ? ?Assessment & Plan:   ?In summary, William Duffy is a 66 y.o. man with newly diagnosed high risk prostate cancer. He would like to pursue robotic prostatectomy, and understands with his significantly elevated PSA of 48 and high risk disease the high likelihood of need for adjuvant treatments with potentially ADT and radiation in the future.  Baseline ED refractory to medications.  ? ?In regards to surgery, we discussed robotic prostatectomy +/- lymphadenectomy at length.  The procedure takes 3 to 4 hours, and patient's typically discharge home on post-op day #1.  A Foley catheter is left in place for 7 to 10 days to allow for healing of the vesicourethral anastomosis.  There is a small risk of bleeding, infection, damage to surrounding structures or bowel, hernia, DVT/PE, or serious cardiac or pulmonary complications.  We discussed at length post-op side effects including erectile dysfunction, and the  importance of pre-operative erectile function on long-term outcomes.  Even with a nerve sparing approach, there is an approximately 25% rate of permanent erectile dysfunction.  We also discussed postop urinary incontinence at length.  We expect patients to have stress incontinence post-operatively that will improve over period of weeks to months.  Less than 10% of men will require a pad at 1 year after surgery.  Patients will need to avoid heavy lifting and strenuous activity for 3 to 4 weeks, but most men return to their baseline activity status by 6 weeks. ? ?Robotic prostatectomy and pelvic LN dissection ? ?Nickolas Madrid, MD ?09/14/2021 ? ?[place] ?[address] ?[place], [place] [address] ?[phone] ? ?

## 2021-09-15 ENCOUNTER — Encounter: Payer: Self-pay | Admitting: Urology

## 2021-09-15 DIAGNOSIS — Z7982 Long term (current) use of aspirin: Secondary | ICD-10-CM | POA: Diagnosis not present

## 2021-09-15 DIAGNOSIS — Z79899 Other long term (current) drug therapy: Secondary | ICD-10-CM | POA: Diagnosis not present

## 2021-09-15 DIAGNOSIS — C61 Malignant neoplasm of prostate: Secondary | ICD-10-CM | POA: Diagnosis not present

## 2021-09-15 DIAGNOSIS — I10 Essential (primary) hypertension: Secondary | ICD-10-CM | POA: Diagnosis not present

## 2021-09-15 LAB — BASIC METABOLIC PANEL
Anion gap: 7 (ref 5–15)
BUN: 19 mg/dL (ref 8–23)
CO2: 25 mmol/L (ref 22–32)
Calcium: 8.5 mg/dL — ABNORMAL LOW (ref 8.9–10.3)
Chloride: 102 mmol/L (ref 98–111)
Creatinine, Ser: 1.15 mg/dL (ref 0.61–1.24)
GFR, Estimated: >60 mL/min (ref >60)
Glucose, Bld: 140 mg/dL — ABNORMAL HIGH (ref 70–99)
Potassium: 4.4 mmol/L (ref 3.5–5.1)
Sodium: 134 mmol/L — ABNORMAL LOW (ref 135–145)

## 2021-09-15 LAB — CBC
HCT: 37.6 % — ABNORMAL LOW (ref 39.0–52.0)
Hemoglobin: 12.7 g/dL — ABNORMAL LOW (ref 13.0–17.0)
MCH: 30.8 pg (ref 26.0–34.0)
MCHC: 33.8 g/dL (ref 30.0–36.0)
MCV: 91 fL (ref 80.0–100.0)
Platelets: 264 10*3/uL (ref 150–400)
RBC: 4.13 MIL/uL — ABNORMAL LOW (ref 4.22–5.81)
RDW: 12.1 % (ref 11.5–15.5)
WBC: 6.8 10*3/uL (ref 4.0–10.5)
nRBC: 0 % (ref 0.0–0.2)

## 2021-09-15 MED ORDER — HYDROCODONE-ACETAMINOPHEN 5-325 MG PO TABS: 1.0000 | ORAL_TABLET | ORAL | 0 refills | Status: DC | PRN

## 2021-09-15 MED ORDER — DOCUSATE SODIUM 100 MG PO CAPS: 100.0000 mg | ORAL_CAPSULE | Freq: Two times a day (BID) | ORAL | 0 refills | Status: DC

## 2021-09-15 NOTE — TOC CM/SW Note (Signed)
Patient has orders to discharge home today. Chart reviewed. PCP is Dr. Netty Starring. On room air. No wounds. No TOC needs identified. CSW signing off. ? ?Dayton Scrape, [place] ?[phone] ? ?

## 2021-09-15 NOTE — Care Management CC44 (Signed)
Condition Code 44 Documentation Completed ? ?Patient Details  ?Name: William Duffy ?MRN: [id number] ?Date of Birth: 08-16-1955 ? ? ?Condition Code 44 given:  Yes ?Patient signature on Condition Code 44 notice:  Yes ?Documentation of 2 MD's agreement:  Yes ?Code 44 added to claim:  Yes ? ? ? ?Candie Chroman, LCSW ?09/15/2021, 9:42 AM ? ?

## 2021-09-15 NOTE — Discharge Instructions (Signed)
Activity:  You are encouraged to ambulate frequently (about every hour during waking hours) to help prevent blood clots from forming in your legs or lungs.  However, you should not engage in any heavy lifting (> 5-10 lbs), strenuous activity, or straining. ? ?Diet: You should advance your diet as instructed by your physician.  It will be normal to have some bloating, nausea, and abdominal discomfort intermittently. ? ?Prescriptions:  You will be provided a prescription for pain medication to take as needed.  If your pain is not severe enough to require the prescription pain medication, you may take extra strength Tylenol instead which will have less side effects.  You should also take a prescribed stool softener to avoid straining with bowel movements as the prescription pain medication may constipate you. Do not use suppositories or enemas for constipation.   ? ?Incisions: You may remove your dressing bandages 48 hours after surgery if not removed in the hospital.  You will either have some small staples or special tissue glue at each of the incision sites. Once the bandages are removed (if present), the incisions may stay open to air.  You may start showering (but not soaking or bathing in water) the 2nd day after surgery and the incisions simply need to be patted dry after the shower.  No additional care is needed. ? ?What to call us about: You should call the office if you develop fever > 101 or develop persistent vomiting, redness or draining around your incision, or any other concerning symptoms.   ? ?[facility] ?[address] ?[place], [place] [address] ?[phone]  ?

## 2021-09-15 NOTE — Progress Notes (Signed)
Urology Consult Follow Up ? ?Subjective: ?POD # 1 robotic radical prostatectomy and bilateral pelvic lymph node dissection ? ?Patient resting comfortably in bed with his sister, Darsell, at bedside. ? ?He stated he had a restful night.  He has not passed any flatus, but he is feeling gas bubbles. ? ?VSS afebrile Good UOP -Foley in place draining orange clear urine ? ?Serum creatinine 1.15 this morning.  CBC this morning with a WBC count of 6.8, hemoglobin 12.7 and hematocrit of 37.6. ? ? ?Anti-infectives: ?Anti-infectives (From admission, onward)  ? ? Start     Dose/Rate Route Frequency Ordered Stop  ? 09/14/21 1046  ceFAZolin (ANCEF) IVPB 2g/100 mL premix       ? 2 g ?200 mL/hr over 30 Minutes Intravenous 30 min pre-op 09/14/21 1046 09/14/21 1243  ? 09/14/21 1041  ceFAZolin (ANCEF) 2-4 GM/100ML-% IVPB       ?Note to Pharmacy: Olena Mater F: cabinet override  ?    09/14/21 1041 09/14/21 1245  ? ?  ? ? ?Current Facility-Administered Medications  ?Medication Dose Route Frequency Provider Last Rate Last Admin  ? 0.45 % sodium chloride infusion   Intravenous Continuous Billey Co, MD 75 mL/hr at 09/15/21 0625 Infusion Verify at 09/15/21 0626  ? acetaminophen (TYLENOL) tablet 1,000 mg  1,000 mg Oral Q6H Billey Co, MD   1,000 mg at 09/15/21 0545  ? docusate sodium (COLACE) capsule 100 mg  100 mg Oral BID Billey Co, MD   100 mg at 09/14/21 2236  ? heparin injection 5,000 Units  5,000 Units Subcutaneous Q8H Billey Co, MD   5,000 Units at 09/15/21 0543  ? HYDROcodone-acetaminophen (NORCO/VICODIN) 5-325 MG per tablet 1-2 tablet  1-2 tablet Oral Q4H PRN Billey Co, MD   1 tablet at 09/14/21 2236  ? HYDROmorphone (DILAUDID) injection 0.5-1 mg  0.5-1 mg Intravenous Q2H PRN Billey Co, MD      ? losartan (COZAAR) tablet 25 mg  25 mg Oral Daily Billey Co, MD      ? ondansetron [facility]) injection 4 mg  4 mg Intravenous Q4H PRN Billey Co, MD      ? pravastatin (PRAVACHOL)  tablet 40 mg  40 mg Oral q1800 Billey Co, MD      ? ? ? ?Objective: ?Vital signs in last 24 hours: ?Temp:  [97.5 ?F (36.4 ?C)-98.9 ?F (37.2 ?C)] 98.8 ?F (37.1 ?C) (03/14 0443) ?Pulse Rate:  [70-98] 96 (03/14 0443) ?Resp:  [10-20] 20 (03/14 0443) ?BP: (103-150)/(57-96) 126/72 (03/14 0443) ?SpO2:  [91 %-99 %] 94 % (03/14 0443) ?Weight:  [102.1 kg] 102.1 kg (03/13 1036) ? ?Intake/Output from previous day: ?03/13 0701 - 03/14 0700 ?In: 3332.4 [P.O.:600; I.V.:2632.4; IV Piggyback:100] ?Out: 1425 [Urine:1175; Blood:250] ?Intake/Output this shift: ?No intake/output data recorded. ? ? ?Physical Exam ?Constitutional:   ?   Appearance: Normal appearance. He is not ill-appearing or toxic-appearing.  ?HENT:  ?   Head: Normocephalic and atraumatic.  ?   Nose: Nose normal.  ?Eyes:  ?   Extraocular Movements: Extraocular movements intact.  ?   Conjunctiva/sclera: Conjunctivae normal.  ?   Pupils: Pupils are equal, round, and reactive to light.  ?Pulmonary:  ?   Effort: Pulmonary effort is normal.  ?Abdominal:  ?   General: There is no distension.  ?   Palpations: Abdomen is soft.  ?   Tenderness: There is no abdominal tenderness. There is no guarding or rebound.  ?   Comments:  Incisions are clean and dry  ?Genitourinary: ?   Penis: Normal.   ?   Comments: Foley in place ?Musculoskeletal:     ?   General: Normal range of motion.  ?   Cervical back: Normal range of motion.  ?Skin: ?   General: Skin is warm and dry.  ?Neurological:  ?   General: No focal deficit present.  ?   Mental Status: He is alert and oriented to person, place, and time.  ?Psychiatric:     ?   Mood and Affect: Mood normal.     ?   Behavior: Behavior normal.     ?   Thought Content: Thought content normal.     ?   Judgment: Judgment normal.  ? ? ?Lab Results:  ?Recent Labs  ?  09/15/21 ?6301  ?WBC 6.8  ?HGB 12.7*  ?HCT 37.6*  ?PLT 264  ? ?BMET ?Recent Labs  ?  09/15/21 ?6010  ?NA 134*  ?K 4.4  ?CL 102  ?CO2 25  ?GLUCOSE 140*  ?BUN 19  ?CREATININE 1.15   ?CALCIUM 8.5*  ? ?PT/INR ?No results for input(s): LABPROT, INR in the last 72 hours. ?ABG ?No results for input(s): PHART, HCO3 in the last 72 hours. ? ?Invalid input(s): PCO2, PO2 ? ?Studies/Results: ?No results found. ? ? ?Assessment and Plan: ?66 year old male with high risk prostate cancer who underwent robotic prostatectomy and bilateral lymph node dissection with Dr. Diamantina Providence yesterday. ? ? ?Recommend: ?-Advance diet as tolerated ?-ambulation up to 4 times today ?-Incentive spirometer every hour while awake ?-Discharge anticipated later today ?-He will be discharged with Foley ?-He has a follow-up appointment on October 20, 2021 for Foley removal and discussion of pathology results ? ? ? ? ? LOS: 1 day  ? ? ?Tiffannie Sloss ?09/15/2021  ?

## 2021-09-15 NOTE — Care Management Obs Status (Signed)
MEDICARE OBSERVATION STATUS NOTIFICATION ? ? ?Patient Details  ?Name: William Duffy ?MRN: [id number] ?Date of Birth: 1956-02-23 ? ? ?Medicare Observation Status Notification Given:  Yes ? ? ? ?Candie Chroman, LCSW ?09/15/2021, 9:42 AM ?

## 2021-09-15 NOTE — Progress Notes (Signed)
AVS reviewed with PT and family and they verbalized understanding of all DC teaching and instructions. PT leaving with FC in place and intact and instructions on care. PT leaving with wife as transportation and has a pt belongings in his possession.  ?

## 2021-09-15 NOTE — Discharge Summary (Signed)
Date of admission: 09/14/2021 ? ?Date of discharge: 09/15/2021 ? ?Admission diagnosis: High risk prostate cancer ? ?Discharge diagnosis: High risk prostate cancer, s/p robotic prostatectomy with bilateral lymph node dissection  ? ?Secondary diagnoses:  ?Patient Active Problem List  ? Diagnosis Date Noted  ? Prostate cancer ([place]) 09/14/2021  ? Other neutropenia ([place]) 11/02/2016  ? Borderline diabetes mellitus 09/15/2015  ? History of hypertension 09/10/2015  ? ? ?History and Physical: For full details, please see admission history and physical. Briefly, William Duffy is a 66 y.o. year old patient with high risk prostate cancer who underwent a robotic prostatectomy with bilateral lymph node dissection on September 14, 2021 with Dr. Nickolas Madrid.  ? ?Hospital Course: Patient tolerated the procedure well.  He was then transferred to the floor after an uneventful PACU stay.  His hospital course was uncomplicated.  On POD#1 he had met discharge criteria: was eating a regular diet, was up and ambulating independently,  pain was well controlled, and was ready to for discharge. ? ?Physical Exam: ?Constitutional:  Well nourished. Alert and oriented, No acute distress. ?HEENT: [place] AT, moist mucus membranes.  Trachea midline ?Cardiovascular: No clubbing, cyanosis, or edema. ?Respiratory: Normal respiratory effort, no increased work of breathing. ?GU: No CVA tenderness.  No bladder fullness or masses.  Foley catheter in place draining clear yellow urine.   ?Neurologic: Grossly intact, no focal deficits, moving all 4 extremities. ?Psychiatric: Normal mood and affect.  ? ?Laboratory values:  ?Recent Labs  ?  09/15/21 ?3704  ?WBC 6.8  ?HGB 12.7*  ?HCT 37.6*  ? ?Recent Labs  ?  09/15/21 ?8889  ?NA 134*  ?K 4.4  ?CL 102  ?CO2 25  ?GLUCOSE 140*  ?BUN 19  ?CREATININE 1.15  ?CALCIUM 8.5*  ? ?No results for input(s): LABPT, INR in the last 72 hours. ?No results for input(s): LABURIN in the last 72 hours. ?Results for orders placed or  performed during the hospital encounter of 09/10/21  ?SARS CORONAVIRUS 2 (TAT 6-24 HRS) Nasopharyngeal Nasopharyngeal Swab     Status: None  ? Collection Time: 09/10/21  9:42 AM  ? Specimen: Nasopharyngeal Swab  ?Result Value Ref Range Status  ? SARS Coronavirus 2 NEGATIVE NEGATIVE Final  ?  Comment: (NOTE) ?SARS-CoV-2 target nucleic acids are NOT DETECTED. ? ?The SARS-CoV-2 RNA is generally detectable in upper and lower ?respiratory specimens during the acute phase of infection. Negative ?results do not preclude SARS-CoV-2 infection, do not rule out ?co-infections with other pathogens, and should not be used as the ?sole basis for treatment or other patient management decisions. ?Negative results must be combined with clinical observations, ?patient history, and epidemiological information. The expected ?result is Negative. ? ?Fact Sheet for Patients: ?SugarRoll.be ? ?Fact Sheet for Healthcare Providers: ?https://www.woods-mathews.com/ ? ?This test is not yet approved or cleared by the [place] FDA and  ?has been authorized for detection and/or diagnosis of SARS-CoV-2 by ?FDA under an Emergency Use Authorization (EUA). This EUA will remain  ?in effect (meaning this test can be used) for the duration of the ?COVID-19 declaration under Se ction 564(b)(1) of the Act, 21 U.S.C. ?section 360bbb-3(b)(1), unless the authorization is terminated or ?revoked sooner. ? ?Performed at [facility], [place] [address], [place], [place] ?[address] ?  ? ? ?Disposition: Home ? ?Discharge instruction: The patient was instructed to be ambulatory but told to refrain from heavy lifting, strenuous activity, or driving.  ? ?Discharge medications:  ?Allergies as of 09/15/2021   ? ?  Reactions  ? Shrimp [shellfish Allergy] Hives, Swelling  ? Throat swells  ? ?  ? ?  ?Medication List  ?  ? ?TAKE these medications   ? ?aspirin EC 81 MG tablet ?Take 81 mg by mouth daily. Swallow whole. ?   ?diclofenac Sodium 1 % Gel ?Commonly known as: VOLTAREN ?Apply 1 application. topically daily as needed (pain). ?  ?docusate sodium 100 MG capsule ?Commonly known as: COLACE ?Take 1 capsule (100 mg total) by mouth 2 (two) times daily. ?  ?HYDROcodone-acetaminophen 5-325 MG tablet ?Commonly known as: NORCO/VICODIN ?Take 1-2 tablets by mouth every 4 (four) hours as needed for moderate pain. ?  ?indomethacin 50 MG capsule ?Commonly known as: INDOCIN ?Take 50 mg by mouth 2 (two) times daily as needed (gout flare). ?  ?losartan 25 MG tablet ?Commonly known as: COZAAR ?Take 25 mg by mouth daily. ?  ?lovastatin 40 MG tablet ?Commonly known as: MEVACOR ?Take 40 mg by mouth at bedtime. ?  ? ?  ? ? ?Followup:   ? ?Patient is already scheduled for an appointment on April 18 at 8:30 AM with Dr. Diamantina Providence in our [facility] clinic for Foley catheter removal and pathology results. ?

## 2021-09-16 ENCOUNTER — Ambulatory Visit: Payer: Medicare HMO

## 2021-09-17 LAB — SURGICAL PATHOLOGY

## 2021-09-18 ENCOUNTER — Telehealth: Payer: Self-pay

## 2021-09-18 NOTE — Telephone Encounter (Signed)
Pt called complaining of leakage around catheter. Pt states he has had urine return in bag. Pt also states he did not have a bowel movement ina couple of days but finally had one today. I advised pt leakage is okay as long as there is urine return in bag and no clots. Pt denies any blood/ blood clots, or any other symptoms at this time.  ?

## 2021-09-22 ENCOUNTER — Ambulatory Visit: Payer: Medicare HMO | Admitting: Urology

## 2021-09-22 ENCOUNTER — Other Ambulatory Visit: Payer: Self-pay

## 2021-09-22 ENCOUNTER — Encounter: Payer: Self-pay | Admitting: Urology

## 2021-09-22 VITALS — BP 144/96 | HR 105 | Ht 66.0 in | Wt 224.0 lb

## 2021-09-22 DIAGNOSIS — C61 Malignant neoplasm of prostate: Secondary | ICD-10-CM

## 2021-09-22 NOTE — Patient Instructions (Signed)
It is normal to have leakage of urine with coughing, sneezing, and heavy lifting after prostate surgery for cancer.  Starting 1 month after surgery, you can start the Kegel exercises below to help strengthen up the muscles and improve leakage. ? ?We will call with PSA results next month, and I suspect we will need to do some radiation for microscopic cells that had spread into the seminal vesicles and through the back of the prostate. ? ?Kegel Exercises ?Kegel exercises can help strengthen your pelvic floor muscles. The pelvic floor is a group of muscles that support your rectum, small intestine, and bladder. In females, pelvic floor muscles also help support the uterus. These muscles help you control the flow of urine and stool (feces). ?Kegel exercises are painless and simple. They do not require any equipment. Your provider may suggest Kegel exercises to: ?Improve bladder and bowel control. ?Improve sexual response. ?Improve weak pelvic floor muscles after surgery to remove the uterus (hysterectomy) or after pregnancy, in females. ?Improve weak pelvic floor muscles after prostate gland removal or surgery, in males. ?Kegel exercises involve squeezing your pelvic floor muscles. These are the same muscles you squeeze when you try to stop the flow of urine or keep from passing gas. The exercises can be done while sitting, standing, or lying down, but it is best to vary your position. ?Ask your health care provider which exercises are safe for you. Do exercises exactly as told by your health care provider and adjust them as directed. Do not begin these exercises until told by your health care provider. ?Exercises ?How to do Kegel exercises: ?Squeeze your pelvic floor muscles tight. You should feel a tight lift in your rectal area. If you are a male, you should also feel a tightness in your vaginal area. Keep your stomach, buttocks, and legs relaxed. ?Hold the muscles tight for up to 10 seconds. ?Breathe  normally. ?Relax your muscles for up to 10 seconds. ?Repeat as told by your health care provider. ?Repeat this exercise daily as told by your health care provider. Continue to do this exercise for at least 4-6 weeks, or for as long as told by your health care provider. ?You may be referred to a physical therapist who can help you learn more about how to do Kegel exercises. ?Depending on your condition, your health care provider may recommend: ?Varying how long you squeeze your muscles. ?Doing several sets of exercises every day. ?Doing exercises for several weeks. ?Making Kegel exercises a part of your regular exercise routine. ?This information is not intended to replace advice given to you by your health care provider. Make sure you discuss any questions you have with your health care provider. ?Document Revised: 10/30/2020 Document Reviewed: 10/30/2020 ?Elsevier Patient Education ? [place]. ? ?

## 2021-09-22 NOTE — Progress Notes (Signed)
Catheter Removal ? ?Patient is present today for a catheter removal. 59m of water was drained from the balloon. A 18FR foley cath was removed from the bladder no complications were noted . Patient tolerated well. ? ?Performed by: OGordy Clement CChester ? ?Follow up/ Additional notes: RTC as scheduled.  ? ?

## 2021-09-22 NOTE — Progress Notes (Signed)
? ?  09/22/2021 ?10:53 AM  ? ?William Duffy ?1955-11-25 ?[id number] ? ?Reason for visit: Follow up high risk prostate cancer ? ?HPI: ?Healthy 66 year old male with a PSA of 52 who was diagnosed with high risk prostate cancer (5+4 = 9) on prostate biopsy with Dr. Bernardo Heater, and PSMA PET scan showed no definite evidence of metastatic disease, but suspected seminal vesicle involvement. ? ?He underwent robotic radical prostatectomy and bilateral pelvic lymph node dissection on 09/14/2021.  There was concern for extension through the seminal vesicles posteriorly.  Pathology showed 5+4= 9 grade group 5 disease with extension through the seminal vesicles posteriorly.  Pelvic lymph nodes were negative for metastatic disease bilaterally. ? ?We reviewed these pathology findings, and my concern for SV invasion and positive margin at the SV's that likely will require additional treatment with radiation and ADT.  I recommended starting with PSA in 4 to 5 weeks, and if this is 0 we could consider monitoring the PSA moving forward, but I have a high suspicion based on intraoperative findings PSA will not be undetectable.  We discussed the need for healing prior to pursuing radiation and ADT. ? ?On exam, abdomen is soft and nontender, nondistended.  Incisions clean with Dermabond.  He is tolerating general diet and having normal bowel movements. ? ?Foley catheter was removed today.  We discussed anticipated stress incontinence, and he can start Kegel exercises in about 1 month. ? ?PSA 4 weeks, call with results and if not undetectable refer to radiation oncology to consider adjuvant radiation/ADT ? ?Billey Co, MD ? ?[facility] ?[address] ?[place], [place] [address] ?[phone] ? ?

## 2021-09-23 ENCOUNTER — Ambulatory Visit
Admission: RE | Admit: 2021-09-23 | Discharge: 2021-09-23 | Disposition: A | Discharge status: Home / Self Care | Payer: Medicare HMO | Source: Ambulatory Visit | Attending: Unknown Physician Specialty | Admitting: Unknown Physician Specialty

## 2021-09-23 DIAGNOSIS — E041 Nontoxic single thyroid nodule: Secondary | ICD-10-CM | POA: Diagnosis not present

## 2021-09-23 DIAGNOSIS — D440 Neoplasm of uncertain behavior of thyroid gland: Secondary | ICD-10-CM | POA: Diagnosis not present

## 2021-09-23 NOTE — Procedures (Signed)
Successful US guided FNA of left inferior thyroid nodule ?No complications. ?See PACS for full report. ? ?Tsosie Billing D, PA-C ?09/23/2021, 2:30 PM ? ? ? ? ?

## 2021-09-23 NOTE — Anesthesia Postprocedure Evaluation (Signed)
Anesthesia Post Note ? ?Patient: William Duffy ? ?Procedure(s) Performed: XI ROBOTIC ASSISTED LAPAROSCOPIC RADICAL PROSTATECTOMY ?PELVIC LYMPH NODE DISSECTION (Bilateral) ? ?Patient location during evaluation: PACU ?Anesthesia Type: General ?Level of consciousness: awake and alert ?Pain management: pain level controlled ?Vital Signs Assessment: post-procedure vital signs reviewed and stable ?Respiratory status: spontaneous breathing, nonlabored ventilation, respiratory function stable and patient connected to nasal cannula oxygen ?Cardiovascular status: blood pressure returned to baseline and stable ?Postop Assessment: no apparent nausea or vomiting ?Anesthetic complications: no ? ? ?No notable events documented. ? ? ?Last Vitals:  ?Vitals:  ? 09/15/21 0443 09/15/21 0809  ?BP: 126/72 115/72  ?Pulse: 96 93  ?Resp: 20 18  ?Temp: 37.1 ?C 36.8 ?C  ?SpO2: 94% (!) 89%  ?  ?Last Pain:  ?Vitals:  ? 09/15/21 1044  ?TempSrc:   ?PainSc: 0-No pain  ? ? ?  ?  ?  ?  ?  ?  ? ?Molli Barrows ? ? ? ? ?

## 2021-10-02 ENCOUNTER — Other Ambulatory Visit: Payer: Medicare HMO

## 2021-10-08 ENCOUNTER — Other Ambulatory Visit: Payer: Medicare HMO

## 2021-10-14 ENCOUNTER — Encounter: Payer: Self-pay | Admitting: Unknown Physician Specialty

## 2021-10-14 LAB — CYTOLOGY - NON PAP

## 2021-10-19 ENCOUNTER — Other Ambulatory Visit: Payer: Self-pay | Admitting: Unknown Physician Specialty

## 2021-10-19 ENCOUNTER — Ambulatory Visit: Payer: Medicare HMO | Admitting: Urology

## 2021-10-19 DIAGNOSIS — E041 Nontoxic single thyroid nodule: Secondary | ICD-10-CM

## 2021-10-19 DIAGNOSIS — I10 Essential (primary) hypertension: Secondary | ICD-10-CM | POA: Diagnosis not present

## 2021-10-19 DIAGNOSIS — E7800 Pure hypercholesterolemia, unspecified: Secondary | ICD-10-CM | POA: Diagnosis not present

## 2021-10-19 DIAGNOSIS — D708 Other neutropenia: Secondary | ICD-10-CM | POA: Diagnosis not present

## 2021-10-19 DIAGNOSIS — R7303 Prediabetes: Secondary | ICD-10-CM | POA: Diagnosis not present

## 2021-10-20 ENCOUNTER — Ambulatory Visit: Payer: Medicare HMO | Admitting: Urology

## 2021-10-22 DIAGNOSIS — Z0000 Encounter for general adult medical examination without abnormal findings: Secondary | ICD-10-CM | POA: Diagnosis not present

## 2021-10-22 DIAGNOSIS — Z6836 Body mass index (BMI) 36.0-36.9, adult: Secondary | ICD-10-CM | POA: Diagnosis not present

## 2021-11-17 DIAGNOSIS — Z0100 Encounter for examination of eyes and vision without abnormal findings: Secondary | ICD-10-CM | POA: Diagnosis not present

## 2021-11-17 DIAGNOSIS — H2511 Age-related nuclear cataract, right eye: Secondary | ICD-10-CM | POA: Diagnosis not present

## 2021-11-17 DIAGNOSIS — H5210 Myopia, unspecified eye: Secondary | ICD-10-CM | POA: Diagnosis not present

## 2022-01-07 ENCOUNTER — Telehealth: Payer: Self-pay | Admitting: *Deleted

## 2022-01-07 DIAGNOSIS — C61 Malignant neoplasm of prostate: Secondary | ICD-10-CM

## 2022-01-07 NOTE — Telephone Encounter (Signed)
Patient called Triage line, reports ongoing urine frequency/leakage. Denies any other urinary symptoms. He requests an appointment for follow up also wants his PSA checked. Appointment scheduled, voiced understanding.

## 2022-01-08 ENCOUNTER — Other Ambulatory Visit
Admission: RE | Admit: 2022-01-08 | Discharge: 2022-01-08 | Disposition: A | Discharge status: Home / Self Care | Payer: Medicare HMO | Attending: Urology | Admitting: Urology

## 2022-01-08 DIAGNOSIS — C61 Malignant neoplasm of prostate: Secondary | ICD-10-CM | POA: Insufficient documentation

## 2022-01-08 LAB — URINALYSIS, COMPLETE (UACMP) WITH MICROSCOPIC
Bilirubin Urine: NEGATIVE
Glucose, UA: NEGATIVE mg/dL
Hgb urine dipstick: NEGATIVE
Ketones, ur: NEGATIVE mg/dL
Leukocytes,Ua: NEGATIVE
Nitrite: NEGATIVE
Protein, ur: NEGATIVE mg/dL
Specific Gravity, Urine: 1.025 (ref 1.005–1.030)
pH: 5 (ref 5.0–8.0)

## 2022-01-08 LAB — PSA: Prostatic Specific Antigen: 24.46 ng/mL — ABNORMAL HIGH (ref 0.00–4.00)

## 2022-01-08 NOTE — Telephone Encounter (Signed)
Lab ordered placed for [place].

## 2022-01-08 NOTE — Addendum Note (Signed)
Addended by: Donalee Citrin on: 01/08/2022 10:21 AM   Modules accepted: Orders

## 2022-01-09 LAB — URINE CULTURE: Culture: NO GROWTH

## 2022-01-13 ENCOUNTER — Encounter: Payer: Self-pay | Admitting: Urology

## 2022-01-13 ENCOUNTER — Ambulatory Visit: Payer: Medicare HMO | Admitting: Urology

## 2022-01-13 VITALS — BP 132/82 | HR 84 | Ht 66.5 in | Wt 225.0 lb

## 2022-01-13 DIAGNOSIS — R32 Unspecified urinary incontinence: Secondary | ICD-10-CM | POA: Diagnosis not present

## 2022-01-13 DIAGNOSIS — C61 Malignant neoplasm of prostate: Secondary | ICD-10-CM

## 2022-01-13 DIAGNOSIS — R972 Elevated prostate specific antigen [PSA]: Secondary | ICD-10-CM

## 2022-01-13 LAB — BLADDER SCAN AMB NON-IMAGING

## 2022-01-13 NOTE — Progress Notes (Signed)
   01/13/2022 10:51 AM   William Duffy April 28, 1956 [id number]  Reason for visit: Follow up prostate cancer  HPI: Healthy 66 year old male with a PSA of 35 who was diagnosed with high risk prostate cancer (5+4 = 9) on prostate biopsy with Dr. Bernardo Heater, and PSMA PET scan showed no definite evidence of metastatic disease, but suspected seminal vesicle involvement.   He underwent robotic radical prostatectomy and bilateral pelvic lymph node dissection on 09/14/2021.  Pathology showed 5+4= 9 grade group 5 disease with extension through the seminal vesicles posteriorly.  Pelvic lymph nodes were negative for metastatic disease bilaterally.  At that time I recommended close follow-up in 1 month for PSA, with plan for referral for ADT and referral to radiation oncology if PSA remained detectable, but unfortunately he did not follow-up.  PSA 01/08/2022 was significantly elevated at 24.5 indicating residual disease.  Urinalysis and urine culture were negative.  He has some persistent moderate stress incontinence requiring depends, but has not worked with pelvic floor physical therapy.  I had a frank conversation with the patient about his significantly elevated PSA after prostatectomy indicating likely significant residual disease locally based on his intraoperative findings of cancer extension through the seminal vesicles posteriorly.  We discussed need for adjuvant treatments likely with radiation and ADT, and potentially more aggressive treatments with his otherwise good health and significantly elevated PSA.  I recommended a repeat PSMA PET scan for staging, and referral to radiation oncology.  We will anticipate at least 6 months of ADT and process started for approval of Eligard.  Referral to pelvic floor physical therapy placed regarding persistent stress incontinence.  Billey Co, [facility] [address], [place], [place] [address] [phone]

## 2022-01-13 NOTE — Patient Instructions (Signed)
You can order a cunningham clamp or Wiesner clamp from [employer] to help with leakage during the day with physical activity  Kegel Exercises  Kegel exercises can help strengthen your pelvic floor muscles. The pelvic floor is a group of muscles that support your rectum, small intestine, and bladder. In females, pelvic floor muscles also help support the uterus. These muscles help you control the flow of urine and stool (feces). Kegel exercises are painless and simple. They do not require any equipment. Your provider may suggest Kegel exercises to: Improve bladder and bowel control. Improve sexual response. Improve weak pelvic floor muscles after surgery to remove the uterus (hysterectomy) or after pregnancy, in females. Improve weak pelvic floor muscles after prostate gland removal or surgery, in males. Kegel exercises involve squeezing your pelvic floor muscles. These are the same muscles you squeeze when you try to stop the flow of urine or keep from passing gas. The exercises can be done while sitting, standing, or lying down, but it is best to vary your position. Ask your health care provider which exercises are safe for you. Do exercises exactly as told by your health care provider and adjust them as directed. Do not begin these exercises until told by your health care provider. Exercises How to do Kegel exercises: Squeeze your pelvic floor muscles tight. You should feel a tight lift in your rectal area. If you are a male, you should also feel a tightness in your vaginal area. Keep your stomach, buttocks, and legs relaxed. Hold the muscles tight for up to 10 seconds. Breathe normally. Relax your muscles for up to 10 seconds. Repeat as told by your health care provider. Repeat this exercise daily as told by your health care provider. Continue to do this exercise for at least 4-6 weeks, or for as long as told by your health care provider. You may be referred to a physical therapist who can  help you learn more about how to do Kegel exercises. Depending on your condition, your health care provider may recommend: Varying how long you squeeze your muscles. Doing several sets of exercises every day. Doing exercises for several weeks. Making Kegel exercises a part of your regular exercise routine. This information is not intended to replace advice given to you by your health care provider. Make sure you discuss any questions you have with your health care provider. Document Revised: 10/30/2020 Document Reviewed: 10/30/2020 Elsevier Patient Education  [place].

## 2022-01-18 ENCOUNTER — Telehealth: Payer: Self-pay

## 2022-01-18 ENCOUNTER — Ambulatory Visit: Payer: Medicare HMO | Admitting: Radiation Oncology

## 2022-01-18 NOTE — Telephone Encounter (Signed)
Called pt scheduled appt for Eligard injection. No PA required.

## 2022-01-19 ENCOUNTER — Ambulatory Visit (INDEPENDENT_AMBULATORY_CARE_PROVIDER_SITE_OTHER): Payer: Medicare HMO | Admitting: Urology

## 2022-01-19 DIAGNOSIS — C61 Malignant neoplasm of prostate: Secondary | ICD-10-CM

## 2022-01-19 MED ORDER — LEUPROLIDE ACETATE (6 MONTH) 45 MG ~~LOC~~ KIT
45.0000 mg | PACK | Freq: Once | SUBCUTANEOUS | Status: AC
  Administered 2022-01-19: 45 mg via SUBCUTANEOUS

## 2022-01-19 NOTE — Progress Notes (Signed)
Eligard SubQ Injection   Due to Prostate Cancer patient is present today for a Eligard Injection.  Medication: Eligard 6 month Dose: 45 mg  Location: right arm Lot: [id number] Exp: 05/2023  Patient tolerated well, no complications were noted.  Performed by: Edwin Dada, [place]   Patient has upcoming appointment with oncology, which will determine the length of ADT therapy. Patient given a reminder continue on Vitamin D 800-1000iu and Calcium 1000-'1200mg'$  daily while on Androgen Deprivation Therapy.  PA approval dates: No PA required.

## 2022-01-25 ENCOUNTER — Encounter: Payer: Self-pay | Admitting: Physical Therapy

## 2022-01-25 ENCOUNTER — Ambulatory Visit: Payer: Medicare HMO | Attending: Urology | Admitting: Physical Therapy

## 2022-01-25 DIAGNOSIS — R29898 Other symptoms and signs involving the musculoskeletal system: Secondary | ICD-10-CM | POA: Diagnosis not present

## 2022-01-25 DIAGNOSIS — R278 Other lack of coordination: Secondary | ICD-10-CM | POA: Diagnosis not present

## 2022-01-25 DIAGNOSIS — R32 Unspecified urinary incontinence: Secondary | ICD-10-CM | POA: Diagnosis not present

## 2022-01-25 DIAGNOSIS — M6281 Muscle weakness (generalized): Secondary | ICD-10-CM | POA: Insufficient documentation

## 2022-01-25 NOTE — Therapy (Signed)
OUTPATIENT PHYSICAL THERAPY MALE PELVIC EVALUATION   Patient Name: William Duffy MRN: [id number] DOB:14-Jul-1955, 66 y.o., male Today's Date: 01/25/2022   PT End of Session - 01/25/22 1036     Visit Number 1    Number of Visits 12    Date for PT Re-Evaluation 04/19/22    Authorization Type [employer]    PT Start Time 1030    PT Stop Time 1110    PT Time Calculation (min) 40 min    Activity Tolerance Patient tolerated treatment well    Behavior During Therapy WFL for tasks assessed/performed             Past Medical History:  Diagnosis Date   Acute lateral meniscus tear of right knee    Alcohol abuse    Colon adenomas    Diet-controlled diabetes mellitus ([place])    pre-diabetic   Diverticulosis    H/O drug abuse ([place])    H/O: gout    Hepatitis    hepatitis c   Hypercholesteremia    Hypertension    Internal hemorrhoids    Liver disease    Neutropenia ([place])    Obesity    Prostate cancer [facility])    Past Surgical History:  Procedure Laterality Date   CATARACT EXTRACTION W/PHACO Left 03/29/2019   Procedure: CATARACT EXTRACTION PHACO AND INTRAOCULAR LENS PLACEMENT ([place])  LEFT;  Surgeon: Marchia Meiers, MD;  Location: [facility] ORS;  Service: Ophthalmology;  Laterality: Left;  [place] 01:35.7 AP% 14.6 CDE 16.14 Fluid Pack Lot #[id number]   COLONOSCOPY WITH PROPOFOL N/A 01/03/2015   Procedure: COLONOSCOPY WITH PROPOFOL;  Surgeon: Lollie Sails, MD;  Location: [facility] ENDOSCOPY;  Service: Endoscopy;  Laterality: N/A;   COLONOSCOPY WITH PROPOFOL N/A 05/06/2015   Procedure: COLONOSCOPY WITH PROPOFOL;  Surgeon: Lollie Sails, MD;  Location: [facility] ENDOSCOPY;  Service: Endoscopy;  Laterality: N/A;   COLONOSCOPY WITH PROPOFOL N/A 06/06/2017   Procedure: COLONOSCOPY WITH PROPOFOL;  Surgeon: Lollie Sails, MD;  Location: [facility] ENDOSCOPY;  Service: Endoscopy;  Laterality: N/A;   KNEE ARTHROSCOPY Right    LIVER BIOPSY     MENISCUS REPAIR     PELVIC LYMPH NODE DISSECTION Bilateral 09/14/2021    Procedure: PELVIC LYMPH NODE DISSECTION;  Surgeon: Billey Co, MD;  Location: [facility] ORS;  Service: Urology;  Laterality: Bilateral;   PROSTATE BIOPSY N/A 07/21/2021   Procedure: PROSTATE BIOPSY;  Surgeon: Abbie Sons, MD;  Location: [facility] ORS;  Service: Urology;  Laterality: N/A;   ROBOT ASSISTED LAPAROSCOPIC RADICAL PROSTATECTOMY N/A 09/14/2021   Procedure: XI ROBOTIC ASSISTED LAPAROSCOPIC RADICAL PROSTATECTOMY;  Surgeon: Billey Co, MD;  Location: [facility] ORS;  Service: Urology;  Laterality: N/A;   TRANSRECTAL ULTRASOUND N/A 07/21/2021   Procedure: TRANSRECTAL ULTRASOUND;  Surgeon: Abbie Sons, MD;  Location: [facility] ORS;  Service: Urology;  Laterality: N/A;   Patient Active Problem List   Diagnosis Date Noted   Prostate cancer ([place]) 09/14/2021   Other neutropenia ([place]) 11/02/2016   Borderline diabetes mellitus 09/15/2015   History of hypertension 09/10/2015    PCP: Dion Body, MD  REFERRING PROVIDER: Billey Co, MD  REFERRING DIAG: R32 (ICD-10-CM) - Urinary incontinence, unspecified type   THERAPY DIAG:  Other lack of coordination  Muscle weakness (generalized)  Poor body mechanics  Rationale for Evaluation and Treatment Rehabilitation  PRECAUTIONS: None  WEIGHT BEARING RESTRICTIONS No  FALLS:  Has patient fallen in last 6 months? No  ONSET DATE: 09/14/2021  SUBJECTIVE:  CHIEF COMPLAINT: Patient notes that since prostate surgery he has continued to deal with SUI. Patient goes through 3-4 depends per week. Patient notes that he is currently using clamp, but will still have frequent urination, voiding 30-60 min intervals. Patient notes with recent roadtrip (3 hours), he stopped once for voiding. Patient notes that he did have leakage without urge during the trip.     PERTINENT HISTORY/CHART REVIEW:  Red flags (bowel/bladder changes, saddle paresthesia, personal history of cancer, h/o spinal tumors, h/o compression fx, h/o abdominal aneurysm, abdominal pain, chills/fever, night sweats, nausea, vomiting, unrelenting pain, first onset of insidious LBP <20 y/o): Positive for presence of cancer  Per urology note: Healthy 66 year old male with a PSA of 48 who was diagnosed with high risk prostate cancer (5+4 = 9) on prostate biopsy with Dr. Bernardo Heater, and PSMA PET scan showed no definite evidence of metastatic disease, but suspected seminal vesicle involvement.   He underwent robotic radical prostatectomy and bilateral pelvic lymph node dissection on 09/14/2021.  Pathology showed 5+4= 9 grade group 5 disease with extension through the seminal vesicles posteriorly.  Pelvic lymph nodes were negative for metastatic disease bilaterally.  At that time I recommended close follow-up in 1 month for PSA, with plan for referral for ADT and referral to radiation oncology if PSA remained detectable, but unfortunately he did not follow-up.   PSA 01/08/2022 was significantly elevated at 24.5 indicating residual disease.  Urinalysis and urine culture were negative.  He has some persistent moderate stress incontinence requiring depends, but has not worked with pelvic floor physical therapy.  PAIN:  Are you having pain? No   OCCUPATION/LEISURE ACTIVITIES:  Park attendant, part-time; [employer] football and basketball  PLOF:  Independent  PATIENT GOALS: Get as much control back as possible   UROLOGICAL HISTORY: Surgical procedures: Radical prostatectomy Catheterization: Yes ; Duration: 8 days Complications: none PSA: 24.5; last measured 01/08/2022 Frequency of urination: every 30 min to 1 hour Incontinence: Urge to void, Walking to the bathroom, Coughing, Sneezing, Laughing, Exercise, and Lifting  Amount: Min/Mod. Protective undergarments: Yes   Type: depends; Number  used/day: 4 (heavy) Fluid Intake: 3x16.9 oz H20, no caffeinated, occasional juices/sodas Nocturia: 3-4x/night Toileting posture: stand Incomplete emptying: No  Pain with urination: Negative Stream: Strong and Weak Urgency: Yes  Difficulty initiating urination: Negative Intermittent stream: Positive Post-micturition dribble: Positive  Frequent UTI: Negative.   GASTROINTESTINAL HISTORY: Denies any significant change in bowel symptoms; does note that bowels have slowed slightly since surgery but are returning to 1x/day. Denies straining.     OBJECTIVE:  DIAGNOSTIC TESTING/IMAGING: See chart  COGNITION:  Patient is oriented to person, place, and time.  Recent memory is intact.  Remote memory is intact.  Attention span and concentration are intact.  Expressive speech is intact.  Patient's fund of knowledge is within normal limits for educational level.    POSTURE/OBSERVATIONS:   Lumbar lordosis: diminished Thoracic kyphosis: WNL Iliac crest height: not formally assessed  Lumbar lateral shift: not formally assessed  Pelvic obliquity: not formally assessed  Leg length discrepancy: not formally assessed    GAIT:  Patient ambulates with decreased pelvic rotation, but grossly WFL.    RANGE OF MOTION: deferred 2/2 to time constraints  AROM (Normal range in degrees) AROM  01/25/2022  Lumbar   Flexion (65)   Extension (30)   Right lateral flexion (25)   Left lateral flexion (25)   Right rotation (30)   Left rotation (30)       Hip LEFT  RIGHT  Flexion (125)    Extension (15)    Abduction (40)    Adduction     Internal Rotation (45)    External Rotation (45)    (* = pain; blank rows = not tested)   SENSATION: deferred 2/2 to time constraints  Grossly intact to light touch bilateral LEs as determined by testing dermatomes L2-S2 Proprioception and hot/cold testing deferred on this date   STRENGTH: MMT deferred 2/2 to time constraints   RLE LLE  Hip Flexion     Hip Extension    Hip Abduction     Hip Adduction     Hip ER     Hip IR     Knee Extension    Knee Flexion    Dorsiflexion     Plantarflexion (seated)    (* = pain; blank rows = not tested)   MUSCLE LENGTH: deferred 2/2 to time constraints  Hamstrings: R: Not done L: Not done [place] (quadriceps): R: Not done L: Not done Thomas (hip flexors): R: Not done L: Not done [place]: R: Not done L: Not done   ABDOMINAL: deferred 2/2 to time constraints  Palpation: Diastasis: Scar mobility: Rib flare:   SPECIAL TESTS: deferred 2/2 to time constraints    PALPATION: deferred 2/2 to time constraints  LOCATION LEFT  RIGHT           Lumbar paraspinals    Quadratus Lumborum    Gluteus Maximus    Gluteus Medius    Deep hip external rotators    PSIS    Fortin's Area (SIJ)    Greater Trochanter    ASIS    Sacral border    Coccyx    Ischial tuberosity    Alcock's Canal    (blank rows = not tested) Graded on 0-4 scale (0 = no pain, 1 = pain, 2 = pain with wincing/grimacing/flinching, 3 = pain with withdrawal, 4 = unwilling to allow palpation)   PHYSICAL PERFORMANCE MEASURES:  STS: WNL Deep Squat: RLE STS: LLE STS:  6MWT: 5TSTS:     EXTERNAL PELVIC EXAM: deferred 2/2 to time constraints None given; testing deferred to later date  Breath coordination: Voluntary Contraction: present/absent Relaxation: full/delayed/non-relaxing Perineal movement with sustained IAP increase ("bear down"): descent/no change/elevation/excessive descent Perineal movement with rapid IAP increase ("cough"): elevation/no change/descent Palpation:     PATIENT EDUCATION:  Patient educated on prognosis, POC, and provided with HEP including: bladder irritants, bulbar urethral massage, PFM contractions 10x10. Patient articulated understanding and returned demonstration. Patient will benefit from further education in order to maximize compliance and understanding for long-term therapeutic  gains.   PATIENT SURVEYS:  International Prostate Symptom Score   How often have you had the sensation of not emptying your bladder? Less than half the time 2    How often have you had to urinate less than every two hours? Almost always  5    How often have you found you stopped and started again several times when you urinated? Less than half the time  2    How often have you found it difficult to postpone urination? Almost always  5    How often have you had a weak urinary stream? About half the time  3    How often have you had to strain to start urination? Not at all  0    How many times did you typically get up at night to urinate? 4 x  4    Total IPSS Score  21  QoL: 6 Terrible   ASSESSMENT:  Clinical Impression: Patient is a 66 year old presenting to clinic with chief complaints of urinary incontinence s/p RP with pending radiation treatments for continued elevated PSA. Today's evaluation is suggestive of deficits in PFM coordination, strength, and endurance, posture, and IAP management as evidenced by diminished lumbar lordosis, UI with urge and stress components, and frequency of urination ranging from every 30-60 minutes. Patient's responses on IPSS outcome measures (21, QoL 6) indicate severe functional limitations/disability/distress. Patient's progress may be limited due to potential soft tissue changes in response to radiation therapy; however, patient's motivation and level of fitness are advantageous. Patient will benefit from continued skilled therapeutic intervention to address deficits in PFM coordination, strength, and endurance, posture, and IAP management in order to increase function and improve overall QOL.   Objective impairments: Abnormal gait, decreased activity tolerance, decreased coordination, decreased endurance, decreased strength, improper body mechanics, and postural dysfunction.   Activity limitations: cleaning, laundry, driving, community activity,  occupation, and yard work.   Personal factors: Age, Behavior pattern, Time since onset of injury/illness/exacerbation, and 3+ comorbidities: borderline DM, HTN, other neutropenia  are also affecting patient's functional outcome.   Rehab Potential: Fair  Clinical decision making: Evolving/moderate complexity  Evaluation complexity: Moderate   GOALS: Goals reviewed with patient? Yes  SHORT TERM GOALS: Target date: 03/08/2022  Patient will demonstrate independence with HEP in order to maximize therapeutic gains and improve carryover from physical therapy sessions to ADLs in the home and community. Baseline: not formally assessed  Goal status: INITIAL   LONG TERM GOALS: Target date: 04/19/2022  Patient will demonstrate improved symptom severity as evidenced by a score of < 15 on IPSS measure for full participation in activities at home and in the community.  Baseline: 21, QoL 6 Goal status: INITIAL  Patient will demonstrate circumferential and sequential contraction of >3/5 MMT, > 5 sec hold x5 and 5 consecutive quick flicks with </= 10 min rest between testing bouts, and relaxation of the PFM coordinated with breath for improved management of intra-abdominal pressure and normal bowel and bladder function without the presence of pain nor incontinence in order to improve participation at home and in the community. Baseline: not formally assessed  Goal status: INITIAL  Patient will report confidence in ability to control bladder > 7/10 in order to demonstrate improved function and ability to participate more fully in activities at home and in the community. Baseline: 0/10 Goal status: INITIAL   PLAN: Rehab frequency: 1x/week  Rehab duration: 12 weeks  Planned interventions: Therapeutic exercises, Therapeutic activity, Neuromuscular re-education, Balance training, Gait training, Patient/Family education, Self Care, Joint mobilization, Orthotic/Fit training, Spinal mobilization,  Cryotherapy, Moist heat, Taping, and Manual therapy    Myles Gip PT, DPT [phone]  01/25/2022, 10:37 AM

## 2022-01-26 ENCOUNTER — Encounter
Admission: RE | Admit: 2022-01-26 | Discharge: 2022-01-26 | Disposition: A | Discharge status: Home / Self Care | Payer: Medicare HMO | Source: Ambulatory Visit | Attending: Urology | Admitting: Urology

## 2022-01-26 DIAGNOSIS — C61 Malignant neoplasm of prostate: Secondary | ICD-10-CM | POA: Diagnosis not present

## 2022-01-26 DIAGNOSIS — R918 Other nonspecific abnormal finding of lung field: Secondary | ICD-10-CM | POA: Diagnosis not present

## 2022-01-26 MED ORDER — PIFLIFOLASTAT F 18 (PYLARIFY) INJECTION
9.0000 | Freq: Once | INTRAVENOUS | Status: AC
  Administered 2022-01-26: 9.26 via INTRAVENOUS

## 2022-01-29 ENCOUNTER — Ambulatory Visit
Admission: RE | Admit: 2022-01-29 | Discharge: 2022-01-29 | Disposition: A | Discharge status: Home / Self Care | Payer: Medicare HMO | Source: Ambulatory Visit | Attending: Radiation Oncology | Admitting: Radiation Oncology

## 2022-01-29 VITALS — BP 149/88 | HR 69 | Temp 96.8°F | Resp 18 | Ht 66.0 in | Wt 229.3 lb

## 2022-01-29 DIAGNOSIS — C61 Malignant neoplasm of prostate: Secondary | ICD-10-CM | POA: Insufficient documentation

## 2022-01-29 DIAGNOSIS — Z191 Hormone sensitive malignancy status: Secondary | ICD-10-CM | POA: Diagnosis not present

## 2022-01-29 NOTE — Progress Notes (Signed)
Radiation Oncology Follow up Note  Name: William Duffy   Date:   01/29/2022 MRN:  [id number] DOB: 1956/02/04    This 66 y.o. male presents to the clinic today for reevaluation and patient with stage IIIc (cT2 N0 M0) Gleason 9 (5+4) adenocarcinoma the prostate 6 presenting with a PSA of 48 status post robotic assisted prostatectomy with biochemical failure and PET scan evidence of residual disease.  REFERRING PROVIDER: Dion Body, MD  HPI: Patient is a 66 year old male originally consulted back in February 2023.  He had PSA up to 40 02/09/2011 cores were positive for Gleason 9 (5+4) adenocarcinoma.  PSMA PET scan at that time showed intense activity in the prostate gland consistent with adenocarcinoma.  There was a small avid right external iliac node which was indeterminate.  Bone scan was negative for metastatic disease.  We discussed salvage radiation therapy at the time should that be indicated..  At the time of robotic prostatectomy there was again Gleason 9 (5+4) adenocarcinoma with extension through the seminal vesicle posteriorly.  Pelvic lymph nodes sampled were negative for metastatic disease.  He underwent a repeat PET CT scan PSMA showing radiotracer activity superiorly in the prostatic bed with nodularity consistent with residual local prostate cancer.  He also had a single radiotracer avid right external iliac node increased in size.  No other evidence of metastatic disease or bony metastatic disease was noted.  His PSA was 24.43 weeks prior.  Seen today for consideration of salvage radiation therapy.  Patient does have some urinary incontinence his bowel function is fine he is having no bony pain at this time.  COMPLICATIONS OF TREATMENT: none  FOLLOW UP COMPLIANCE: keeps appointments   PHYSICAL EXAM:  BP (!) 149/88   Pulse 69   Temp (!) 96.8 F (36 C)   Resp 18   Ht '5\' 6"'$  (1.676 m)   Wt 229 lb 4.8 oz (104 kg)   BMI 37.01 kg/m  Well-developed well-nourished patient in  NAD. HEENT reveals PERLA, EOMI, discs not visualized.  Oral cavity is clear. No oral mucosal lesions are identified. Neck is clear without evidence of cervical or supraclavicular adenopathy. Lungs are clear to A&P. Cardiac examination is essentially unremarkable with regular rate and rhythm without murmur rub or thrill. Abdomen is benign with no organomegaly or masses noted. Motor sensory and DTR levels are equal and symmetric in the upper and lower extremities. Cranial nerves II through XII are grossly intact. Proprioception is intact. No peripheral adenopathy or edema is identified. No motor or sensory levels are noted. Crude visual fields are within normal range.  RADIOLOGY RESULTS: PSMA PET scan reviewed compatible with above-stated findings  PLAN: At this time like to go ahead with salvage radiation therapy.  Would plan on delivering 48 Pearline Cables to his prostatic fossa targeting the areas of PET positivity in his prostatic fossa.  Remainder of the prostatic fossa will receive 76 Gray.  Would also treat his hypermetabolic lymph node to 60 Gray remainder of pelvic lymph nodes we will see 43 Gray using IMRT treatment planning and delivery dose painting technique.  Risks and benefits of treatment including increased lower urinary tract symptoms possible worsening of his urinary incontinence fatigue diarrhea alteration of blood counts skin reaction all were described in detail to the patient.  He comprehends my treatment plan well.  He is already been started on Eligard.  He will probably need suppression for 2 to 3 years.  I have personally set up and ordered CT simulation.  I would like to take this opportunity to thank you for allowing me to participate in the care of your patient.Noreene Filbert, MD

## 2022-02-01 ENCOUNTER — Encounter: Payer: Self-pay | Admitting: Physical Therapy

## 2022-02-01 ENCOUNTER — Ambulatory Visit: Payer: Medicare HMO | Admitting: Physical Therapy

## 2022-02-01 DIAGNOSIS — R278 Other lack of coordination: Secondary | ICD-10-CM

## 2022-02-01 DIAGNOSIS — M6281 Muscle weakness (generalized): Secondary | ICD-10-CM | POA: Diagnosis not present

## 2022-02-01 DIAGNOSIS — R29898 Other symptoms and signs involving the musculoskeletal system: Secondary | ICD-10-CM | POA: Diagnosis not present

## 2022-02-01 DIAGNOSIS — R32 Unspecified urinary incontinence: Secondary | ICD-10-CM | POA: Diagnosis not present

## 2022-02-01 NOTE — Therapy (Signed)
OUTPATIENT PHYSICAL THERAPY MALE PELVIC TREATMENT   Patient Name: William Duffy MRN: [id number] DOB:Jul 05, 1956, 66 y.o., male Today's Date: 02/01/2022   PT End of Session - 02/01/22 1030     Visit Number 2    Number of Visits 12    Date for PT Re-Evaluation 04/19/22    Authorization Type [employer]    PT Start Time 1030    PT Stop Time 1110    PT Time Calculation (min) 40 min    Activity Tolerance Patient tolerated treatment well    Behavior During Therapy WFL for tasks assessed/performed             Past Medical History:  Diagnosis Date   Acute lateral meniscus tear of right knee    Alcohol abuse    Colon adenomas    Diet-controlled diabetes mellitus ([place])    pre-diabetic   Diverticulosis    H/O drug abuse ([place])    H/O: gout    Hepatitis    hepatitis c   Hypercholesteremia    Hypertension    Internal hemorrhoids    Liver disease    Neutropenia ([place])    Obesity    Prostate cancer [facility])    Past Surgical History:  Procedure Laterality Date   CATARACT EXTRACTION W/PHACO Left 03/29/2019   Procedure: CATARACT EXTRACTION PHACO AND INTRAOCULAR LENS PLACEMENT ([place])  LEFT;  Surgeon: Marchia Meiers, MD;  Location: [facility] ORS;  Service: Ophthalmology;  Laterality: Left;  [place] 01:35.7 AP% 14.6 CDE 16.14 Fluid Pack Lot #[id number]   COLONOSCOPY WITH PROPOFOL N/A 01/03/2015   Procedure: COLONOSCOPY WITH PROPOFOL;  Surgeon: Lollie Sails, MD;  Location[facility] ENDOSCOPY;  Service: Endoscopy;  Laterality: N/A;   COLONOSCOPY WITH PROPOFOL N/A 05/06/2015   Procedure: COLONOSCOPY WITH PROPOFOL;  Surgeon: Lollie Sails, MD;  Location: [facility] ENDOSCOPY;  Service: Endoscopy;  Laterality: N/A;   COLONOSCOPY WITH PROPOFOL N/A 06/06/2017   Procedure: COLONOSCOPY WITH PROPOFOL;  Surgeon: Lollie Sails, MD;  Location: [facility] ENDOSCOPY;  Service: Endoscopy;  Laterality: N/A;   KNEE ARTHROSCOPY Right    LIVER BIOPSY     MENISCUS REPAIR     PELVIC LYMPH NODE DISSECTION Bilateral 09/14/2021    Procedure: PELVIC LYMPH NODE DISSECTION;  Surgeon: Billey Co, MD;  Location: [facility] ORS;  Service: Urology;  Laterality: Bilateral;   PROSTATE BIOPSY N/A 07/21/2021   Procedure: PROSTATE BIOPSY;  Surgeon: Abbie Sons, MD;  Location: [facility] ORS;  Service: Urology;  Laterality: N/A;   ROBOT ASSISTED LAPAROSCOPIC RADICAL PROSTATECTOMY N/A 09/14/2021   Procedure: XI ROBOTIC ASSISTED LAPAROSCOPIC RADICAL PROSTATECTOMY;  Surgeon: Billey Co, MD;  Location: [facility] ORS;  Service: Urology;  Laterality: N/A;   TRANSRECTAL ULTRASOUND N/A 07/21/2021   Procedure: TRANSRECTAL ULTRASOUND;  Surgeon: Abbie Sons, MD;  Location: [facility] ORS;  Service: Urology;  Laterality: N/A;   Patient Active Problem List   Diagnosis Date Noted   Prostate cancer ([place]) 09/14/2021   Other neutropenia ([place]) 11/02/2016   Borderline diabetes mellitus 09/15/2015   History of hypertension 09/10/2015    PCP: Dion Body, MD  REFERRING PROVIDER: Billey Co, MD  REFERRING DIAG: R32 (ICD-10-CM) - Urinary incontinence, unspecified type   THERAPY DIAG:  No diagnosis found.  Rationale for Evaluation and Treatment Rehabilitation  PRECAUTIONS: None  WEIGHT BEARING RESTRICTIONS No  FALLS:  Has patient fallen in last 6 months? No  ONSET DATE: 09/14/2021  SUBJECTIVE:  CHIEF COMPLAINT: Patient notes that since prostate surgery he has continued to deal with SUI. Patient goes through 3-4 depends per week. Patient notes that he is currently using clamp, but will still have frequent urination, voiding 30-60 min intervals. Patient notes with recent roadtrip (3 hours), he stopped once for voiding. Patient notes that he did have leakage without urge during the trip.    PERTINENT HISTORY/CHART REVIEW:  Red flags (bowel/bladder  changes, saddle paresthesia, personal history of cancer, h/o spinal tumors, h/o compression fx, h/o abdominal aneurysm, abdominal pain, chills/fever, night sweats, nausea, vomiting, unrelenting pain, first onset of insidious LBP <20 y/o): Positive for presence of cancer  Per urology note: Healthy 66 year old male with a PSA of 38 who was diagnosed with high risk prostate cancer (5+4 = 9) on prostate biopsy with Dr. Bernardo Heater, and PSMA PET scan showed no definite evidence of metastatic disease, but suspected seminal vesicle involvement.   He underwent robotic radical prostatectomy and bilateral pelvic lymph node dissection on 09/14/2021.  Pathology showed 5+4= 9 grade group 5 disease with extension through the seminal vesicles posteriorly.  Pelvic lymph nodes were negative for metastatic disease bilaterally.  At that time I recommended close follow-up in 1 month for PSA, with plan for referral for ADT and referral to radiation oncology if PSA remained detectable, but unfortunately he did not follow-up.   PSA 01/08/2022 was significantly elevated at 24.5 indicating residual disease.  Urinalysis and urine culture were negative.  He has some persistent moderate stress incontinence requiring depends, but has not worked with pelvic floor physical therapy.  PAIN:  Are you having pain? No   OCCUPATION/LEISURE ACTIVITIES:  Park attendant, part-time; [employer] football and basketball  PLOF:  Independent  PATIENT GOALS: Get as much control back as possible   UROLOGICAL HISTORY: Surgical procedures: Radical prostatectomy Catheterization: Yes ; Duration: 8 days Complications: none PSA: 24.5; last measured 01/08/2022 Frequency of urination: every 30 min to 1 hour Incontinence: Urge to void, Walking to the bathroom, Coughing, Sneezing, Laughing, Exercise, and Lifting  Amount: Min/Mod. Protective undergarments: Yes   Type: depends; Number used/day: 4 (heavy) Fluid Intake: 3x16.9 oz H20, no caffeinated,  occasional juices/sodas Nocturia: 3-4x/night Toileting posture: stand Incomplete emptying: No  Pain with urination: Negative Stream: Strong and Weak Urgency: Yes  Difficulty initiating urination: Negative Intermittent stream: Positive Post-micturition dribble: Positive  Frequent UTI: Negative.   GASTROINTESTINAL HISTORY: Denies any significant change in bowel symptoms; does note that bowels have slowed slightly since surgery but are returning to 1x/day. Denies straining.    02/01/2022 TREATMENT SUBJECTIVE:  Patient denies any significant changes or concerns since evaluation. Patient is set to start radiation 02/10/2022. Patient does note a little strain in his lower back this morning, and since surgical procedure has been sleeping on his back.   PAIN:  Are you having pain? No   POSTURE/OBSERVATIONS:   Lumbar lordosis: diminished Thoracic kyphosis: WNL Iliac crest height: not formally assessed  Lumbar lateral shift: not formally assessed  Pelvic obliquity: not formally assessed  Leg length discrepancy: not formally assessed    RANGE OF MOTION:   AROM (Normal range in degrees) AROM  02/01/2022  Lumbar   Flexion (65) WNL  Extension (30) WNL  Right lateral flexion (25) WNL  Left lateral flexion (25) WNL  Right rotation (30) WNL  Left rotation (30) WNL      Hip LEFT RIGHT  Flexion (125) WFL WFL  Extension (15) WFL WFL  Abduction (40) WFL WFL  Adduction  [facility] WFL  Internal Rotation (45) WFL WFL  External Rotation (45) WFL WFL  (* = pain; blank rows = not tested)   SENSATION:   Grossly intact to light touch bilateral LEs as determined by testing dermatomes L2-S2 Proprioception and hot/cold testing deferred on this date   ABDOMINAL:   Palpation: no TTP Diastasis:~ 2-2.5 fingers  throughout Scar mobility: Rib flare: B  EXTERNAL PELVIC EXAM:  Patient educated on the purpose of the procedure/exam and articulated understanding and consented to the procedure/exam. and verbal  Breath coordination: present Voluntary Contraction: present, palpable squeeze and lift, 5 reps Relaxation: full Perineal movement with sustained IAP increase ("bear down"): descent Perineal movement with rapid IAP increase ("cough"): no change   TREATMENT  Pre-treatment assessment:  Manual Therapy:   Neuromuscular Re-education: Supine hooklying, sidelying, and seated diaphragmatic breathing with VCs and TCs for downregulation of the nervous system and improved management of IAP Sidelying, PFM contractions with exhalation. VCs and TCs to decrease compensatory patterns and encourage activation of the PFM. Seated TrA activation with exhalation. VCs and TCs to decrease compensatory patterns and minimize aggravation of the lumbar paraspinals.  Therapeutic Exercise: Seated figure 4 stretch for improved PFM length Seated hamstring stretch for improved PFM length  Treatments unbilled:  Post-treatment assessment:  Patient educated throughout session on appropriate technique and form using multi-modal cueing, HEP, and activity modification. Patient articulated understanding and returned demonstration.  Patient Response to interventions:   PATIENT SURVEYS: 01/27/2022 International Prostate Symptom Score   How often have you had the sensation of not emptying your bladder? Less than half the time 2    How often have you had to urinate less than every two hours? Almost always  5    How often have you found you stopped and started again several times when you urinated? Less than half the time  2    How often have you found it difficult to postpone urination? Almost always  5    How often have you had a weak urinary stream? About half the time  3    How often have you had to strain to start  urination? Not at all  0    How many times did you typically get up at night to urinate? 4 x  4    Total IPSS Score  21  QoL: 6 Terrible   ASSESSMENT:  Clinical Impression: Patient presents to clinic with excellent motivation to participate in therapy. Patient demonstrates deficits in PFM coordination, strength, and endurance, posture, and IAP management. Patient able to achieve seated TrA  activation and sidelying PFM contraction during today's session and responded positively to active interventions. We discussed the option of continuing with a comprehensive HEP while undergoing radiation treatments with return to clinic after completing to decrease likelihood of burnout form increased medical appointments; patient amenable and will return in one week for progression and alternative strengthening interventions as part of HEP prior to starting radiation therapy. Patient will benefit from continued skilled therapeutic intervention to address remaining deficits in PFM coordination, strength, and endurance, posture, and IAP management in order to increase function and improve overall QOL.  Objective impairments: Abnormal gait, decreased activity tolerance, decreased coordination, decreased endurance, decreased strength, improper body mechanics, and postural dysfunction.   Activity limitations: cleaning, laundry, driving, community activity, occupation, and yard work.   Personal factors: Age, Behavior pattern, Time since onset of injury/illness/exacerbation, and 3+ comorbidities: borderline DM, HTN, other neutropenia  are also affecting patient's functional outcome.   Rehab Potential: Fair  Clinical decision making: Evolving/moderate complexity  Evaluation complexity: Moderate   GOALS: Goals reviewed with patient? Yes  SHORT TERM GOALS: Target date: 03/08/2022  Patient will demonstrate independence with HEP in order to maximize therapeutic gains and improve carryover from physical therapy  sessions to ADLs in the home and community. Baseline: not formally assessed  Goal status: INITIAL   LONG TERM GOALS: Target date: 04/19/2022  Patient will demonstrate improved symptom severity as evidenced by a score of < 15 on IPSS measure for full participation in activities at home and in the community.  Baseline: 21, QoL 6 Goal status: INITIAL  Patient will demonstrate circumferential and sequential contraction of >3/5 MMT, > 5 sec hold x5 and 5 consecutive quick flicks with </= 10 min rest between testing bouts, and relaxation of the PFM coordinated with breath for improved management of intra-abdominal pressure and normal bowel and bladder function without the presence of pain nor incontinence in order to improve participation at home and in the community. Baseline: not formally assessed  Goal status: INITIAL  Patient will report confidence in ability to control bladder > 7/10 in order to demonstrate improved function and ability to participate more fully in activities at home and in the community. Baseline: 0/10 Goal status: INITIAL   PLAN: Rehab frequency: 1x/week  Rehab duration: 12 weeks  Planned interventions: Therapeutic exercises, Therapeutic activity, Neuromuscular re-education, Balance training, Gait training, Patient/Family education, Self Care, Joint mobilization, Orthotic/Fit training, Spinal mobilization, Cryotherapy, Moist heat, Taping, and Manual therapy    Myles Gip PT, DPT [phone]  02/01/2022, 10:30 AM

## 2022-02-02 ENCOUNTER — Ambulatory Visit
Admission: RE | Admit: 2022-02-02 | Discharge: 2022-02-02 | Disposition: A | Discharge status: Home / Self Care | Payer: Medicare HMO | Source: Ambulatory Visit | Attending: Radiation Oncology | Admitting: Radiation Oncology

## 2022-02-02 DIAGNOSIS — Z191 Hormone sensitive malignancy status: Secondary | ICD-10-CM | POA: Diagnosis not present

## 2022-02-02 DIAGNOSIS — C61 Malignant neoplasm of prostate: Secondary | ICD-10-CM | POA: Insufficient documentation

## 2022-02-02 DIAGNOSIS — Z510 Encounter for antineoplastic radiation therapy: Secondary | ICD-10-CM | POA: Insufficient documentation

## 2022-02-05 ENCOUNTER — Other Ambulatory Visit: Payer: Self-pay | Admitting: *Deleted

## 2022-02-05 DIAGNOSIS — C61 Malignant neoplasm of prostate: Secondary | ICD-10-CM

## 2022-02-08 ENCOUNTER — Ambulatory Visit: Payer: Medicare HMO | Attending: Urology | Admitting: Physical Therapy

## 2022-02-08 ENCOUNTER — Encounter: Payer: Self-pay | Admitting: Physical Therapy

## 2022-02-08 DIAGNOSIS — R278 Other lack of coordination: Secondary | ICD-10-CM | POA: Diagnosis not present

## 2022-02-08 DIAGNOSIS — R29898 Other symptoms and signs involving the musculoskeletal system: Secondary | ICD-10-CM | POA: Diagnosis not present

## 2022-02-08 DIAGNOSIS — M6281 Muscle weakness (generalized): Secondary | ICD-10-CM | POA: Diagnosis not present

## 2022-02-08 NOTE — Therapy (Signed)
OUTPATIENT PHYSICAL THERAPY MALE PELVIC TREATMENT   Patient Name: William Duffy MRN: [id number] DOB:1956-06-05, 66 y.o., male Today's Date: 02/08/2022   PT End of Session - 02/08/22 1029     Visit Number 3    Number of Visits 12    Date for PT Re-Evaluation 04/19/22    Authorization Type [employer]    PT Start Time 1030    PT Stop Time 1110    PT Time Calculation (min) 40 min    Activity Tolerance Patient tolerated treatment well    Behavior During Therapy WFL for tasks assessed/performed             Past Medical History:  Diagnosis Date   Acute lateral meniscus tear of right knee    Alcohol abuse    Colon adenomas    Diet-controlled diabetes mellitus ([place])    pre-diabetic   Diverticulosis    H/O drug abuse ([place])    H/O: gout    Hepatitis    hepatitis c   Hypercholesteremia    Hypertension    Internal hemorrhoids    Liver disease    Neutropenia ([place])    Obesity    Prostate cancer [facility])    Past Surgical History:  Procedure Laterality Date   CATARACT EXTRACTION W/PHACO Left 03/29/2019   Procedure: CATARACT EXTRACTION PHACO AND INTRAOCULAR LENS PLACEMENT ([place])  LEFT;  Surgeon: Marchia Meiers, MD;  Location: [facility] ORS;  Service: Ophthalmology;  Laterality: Left;  [place] 01:35.7 AP% 14.6 CDE 16.14 Fluid Pack Lot #[id number]   COLONOSCOPY WITH PROPOFOL N/A 01/03/2015   Procedure: COLONOSCOPY WITH PROPOFOL;  Surgeon: Lollie Sails, MD;  Location: [facility] ENDOSCOPY;  Service: Endoscopy;  Laterality: N/A;   COLONOSCOPY WITH PROPOFOL N/A 05/06/2015   Procedure: COLONOSCOPY WITH PROPOFOL;  Surgeon: Lollie Sails, MD;  Location: [facility] ENDOSCOPY;  Service: Endoscopy;  Laterality: N/A;   COLONOSCOPY WITH PROPOFOL N/A 06/06/2017   Procedure: COLONOSCOPY WITH PROPOFOL;  Surgeon: Lollie Sails, MD;  Location: [facility] ENDOSCOPY;  Service: Endoscopy;  Laterality: N/A;   KNEE ARTHROSCOPY Right    LIVER BIOPSY     MENISCUS REPAIR     PELVIC LYMPH NODE DISSECTION Bilateral 09/14/2021    Procedure: PELVIC LYMPH NODE DISSECTION;  Surgeon: Billey Co, MD;  Location: [facility] ORS;  Service: Urology;  Laterality: Bilateral;   PROSTATE BIOPSY N/A 07/21/2021   Procedure: PROSTATE BIOPSY;  Surgeon: Abbie Sons, MD;  Location: [facility] ORS;  Service: Urology;  Laterality: N/A;   ROBOT ASSISTED LAPAROSCOPIC RADICAL PROSTATECTOMY N/A 09/14/2021   Procedure: XI ROBOTIC ASSISTED LAPAROSCOPIC RADICAL PROSTATECTOMY;  Surgeon: Billey Co, MD;  Location: [facility] ORS;  Service: Urology;  Laterality: N/A;   TRANSRECTAL ULTRASOUND N/A 07/21/2021   Procedure: TRANSRECTAL ULTRASOUND;  Surgeon: Abbie Sons, MD;  Location: [facility] ORS;  Service: Urology;  Laterality: N/A;   Patient Active Problem List   Diagnosis Date Noted   Prostate cancer ([place]) 09/14/2021   Other neutropenia ([place]) 11/02/2016   Borderline diabetes mellitus 09/15/2015   History of hypertension 09/10/2015    PCP: Dion Body, MD  REFERRING PROVIDER: Billey Co, MD  REFERRING DIAG: R32 (ICD-10-CM) - Urinary incontinence, unspecified type   THERAPY DIAG:  Other lack of coordination  Muscle weakness (generalized)  Poor body mechanics  Rationale for Evaluation and Treatment Rehabilitation  PRECAUTIONS: None  WEIGHT BEARING RESTRICTIONS No  FALLS:  Has patient fallen in last 6 months? No  ONSET DATE: 09/14/2021  SUBJECTIVE:  CHIEF COMPLAINT: Patient notes that since prostate surgery he has continued to deal with SUI. Patient goes through 3-4 depends per week. Patient notes that he is currently using clamp, but will still have frequent urination, voiding 30-60 min intervals. Patient notes with recent roadtrip (3 hours), he stopped once for voiding. Patient notes that he did have leakage without urge during the trip.     PERTINENT HISTORY/CHART REVIEW:  Red flags (bowel/bladder changes, saddle paresthesia, personal history of cancer, h/o spinal tumors, h/o compression fx, h/o abdominal aneurysm, abdominal pain, chills/fever, night sweats, nausea, vomiting, unrelenting pain, first onset of insidious LBP <20 y/o): Positive for presence of cancer  Per urology note: Healthy 66 year old male with a PSA of 22 who was diagnosed with high risk prostate cancer (5+4 = 9) on prostate biopsy with Dr. Bernardo Heater, and PSMA PET scan showed no definite evidence of metastatic disease, but suspected seminal vesicle involvement.   He underwent robotic radical prostatectomy and bilateral pelvic lymph node dissection on 09/14/2021.  Pathology showed 5+4= 9 grade group 5 disease with extension through the seminal vesicles posteriorly.  Pelvic lymph nodes were negative for metastatic disease bilaterally.  At that time I recommended close follow-up in 1 month for PSA, with plan for referral for ADT and referral to radiation oncology if PSA remained detectable, but unfortunately he did not follow-up.   PSA 01/08/2022 was significantly elevated at 24.5 indicating residual disease.  Urinalysis and urine culture were negative.  He has some persistent moderate stress incontinence requiring depends, but has not worked with pelvic floor physical therapy.  PAIN:  Are you having pain? No   OCCUPATION/LEISURE ACTIVITIES:  Park attendant, part-time; [employer] football and basketball  PLOF:  Independent  PATIENT GOALS: Get as much control back as possible   UROLOGICAL HISTORY: Surgical procedures: Radical prostatectomy Catheterization: Yes ; Duration: 8 days Complications: none PSA: 24.5; last measured 01/08/2022 Frequency of urination: every 30 min to 1 hour Incontinence: Urge to void, Walking to the bathroom, Coughing, Sneezing, Laughing, Exercise, and Lifting  Amount: Min/Mod. Protective undergarments: Yes   Type: depends; Number  used/day: 4 (heavy) Fluid Intake: 3x16.9 oz H20, no caffeinated, occasional juices/sodas Nocturia: 3-4x/night Toileting posture: stand Incomplete emptying: No  Pain with urination: Negative Stream: Strong and Weak Urgency: Yes  Difficulty initiating urination: Negative Intermittent stream: Positive Post-micturition dribble: Positive  Frequent UTI: Negative.   GASTROINTESTINAL HISTORY: Denies any significant change in bowel symptoms; does note that bowels have slowed slightly since surgery but are returning to 1x/day. Denies straining.    02/08/2022 TREATMENT SUBJECTIVE:  Patient denies any significant changes or concerns since evaluation. Patient notes that he had no issues with exercises from last week.   PAIN:  Are you having pain? No   POSTURE/OBSERVATIONS:   Lumbar lordosis: diminished Thoracic kyphosis: WNL Iliac crest height: not formally assessed  Lumbar lateral shift: not formally assessed  Pelvic obliquity: not formally assessed  Leg length discrepancy: not formally assessed    RANGE OF MOTION:   AROM (Normal range in degrees) AROM    Lumbar   Flexion (65) WNL  Extension (30) WNL  Right lateral flexion (25) WNL  Left lateral flexion (25) WNL  Right rotation (30) WNL  Left rotation (30) WNL      Hip LEFT RIGHT  Flexion (125) WFL WFL  Extension (15) WFL WFL  Abduction (40) WFL WFL  Adduction  [facility] WFL  Internal Rotation (45) WFL WFL  External Rotation (45) WFL WFL  (* = pain; blank rows = not tested)   SENSATION:   Grossly intact to light touch bilateral LEs as determined by testing dermatomes L2-S2 Proprioception and hot/cold testing deferred on this date   ABDOMINAL:   Palpation: no TTP Diastasis:~ 2-2.5 fingers throughout Scar mobility: Rib flare:  B  EXTERNAL PELVIC EXAM:  Patient educated on the purpose of the procedure/exam and articulated understanding and consented to the procedure/exam. and verbal  Breath coordination: present Voluntary Contraction: present, palpable squeeze and lift, 5 reps Relaxation: full Perineal movement with sustained IAP increase ("bear down"): descent Perineal movement with rapid IAP increase ("cough"): no change   TREATMENT  Pre-treatment assessment:  Manual Therapy:   Neuromuscular Re-education: Patient performed deep core/postural control interventions: - Supine Isometric Transversus Abdominis Contraction  - Hooklying Small March  - Bent Knee Fallouts  - Dead Bug  - Isometric Anti-Rotation Press   - [place] with Chest Press  - Serve a Tray   Therapeutic Exercise:   Treatments unbilled:  Post-treatment assessment:  Patient educated throughout session on appropriate technique and form using multi-modal cueing, HEP, and activity modification. Patient articulated understanding and returned demonstration.  Patient Response to interventions: Comfortable to use HEP until after radiation therapy  PATIENT SURVEYS: 01/27/2022 International Prostate Symptom Score   How often have you had the sensation of not emptying your bladder? Less than half the time 2    How often have you had to urinate less than every two hours? Almost always  5    How often have you found you stopped and started again several times when you urinated? Less than half the time  2    How often have you found it difficult to postpone urination? Almost always  5    How often have you had a weak urinary stream? About half the time  3    How often have you had to strain to start urination? Not at all  0    How many times did you typically get up at night to urinate? 4 x  4    Total IPSS Score  21  QoL: 6 Terrible   ASSESSMENT:  Clinical Impression: Patient presents to clinic with  excellent motivation to participate in therapy. Patient demonstrates deficits in PFM coordination, strength, and endurance, posture, and IAP management. Patient with excellent postural control in both supine and standing interventions during today's session and responded positively to active interventions. Patient comfortable to continue with HEP while attending RT appointments over the next several weeks, and agreed to reach back  out to schedule once he completes treatment. Patient will benefit from continued skilled therapeutic intervention to address remaining deficits in PFM coordination, strength, and endurance, posture, and IAP management in order to increase function and improve overall QOL.  Objective impairments: Abnormal gait, decreased activity tolerance, decreased coordination, decreased endurance, decreased strength, improper body mechanics, and postural dysfunction.   Activity limitations: cleaning, laundry, driving, community activity, occupation, and yard work.   Personal factors: Age, Behavior pattern, Time since onset of injury/illness/exacerbation, and 3+ comorbidities: borderline DM, HTN, other neutropenia  are also affecting patient's functional outcome.   Rehab Potential: Fair  Clinical decision making: Evolving/moderate complexity  Evaluation complexity: Moderate   GOALS: Goals reviewed with patient? Yes  SHORT TERM GOALS: Target date: 03/08/2022  Patient will demonstrate independence with HEP in order to maximize therapeutic gains and improve carryover from physical therapy sessions to ADLs in the home and community. Baseline: not formally assessed  Goal status: INITIAL   LONG TERM GOALS: Target date: 04/19/2022  Patient will demonstrate improved symptom severity as evidenced by a score of < 15 on IPSS measure for full participation in activities at home and in the community.  Baseline: 21, QoL 6 Goal status: INITIAL  Patient will demonstrate circumferential and  sequential contraction of >3/5 MMT, > 5 sec hold x5 and 5 consecutive quick flicks with </= 10 min rest between testing bouts, and relaxation of the PFM coordinated with breath for improved management of intra-abdominal pressure and normal bowel and bladder function without the presence of pain nor incontinence in order to improve participation at home and in the community. Baseline: not formally assessed  Goal status: INITIAL  Patient will report confidence in ability to control bladder > 7/10 in order to demonstrate improved function and ability to participate more fully in activities at home and in the community. Baseline: 0/10 Goal status: INITIAL   PLAN: Rehab frequency: 1x/week  Rehab duration: 12 weeks  Planned interventions: Therapeutic exercises, Therapeutic activity, Neuromuscular re-education, Balance training, Gait training, Patient/Family education, Self Care, Joint mobilization, Orthotic/Fit training, Spinal mobilization, Cryotherapy, Moist heat, Taping, and Manual therapy    Myles Gip PT, DPT [phone]  02/08/2022, 10:29 AM

## 2022-02-09 DIAGNOSIS — Z510 Encounter for antineoplastic radiation therapy: Secondary | ICD-10-CM | POA: Diagnosis not present

## 2022-02-09 DIAGNOSIS — C61 Malignant neoplasm of prostate: Secondary | ICD-10-CM | POA: Diagnosis not present

## 2022-02-09 DIAGNOSIS — Z191 Hormone sensitive malignancy status: Secondary | ICD-10-CM | POA: Diagnosis not present

## 2022-02-10 ENCOUNTER — Ambulatory Visit: Admission: RE | Admit: 2022-02-10 | Payer: Medicare HMO | Source: Ambulatory Visit

## 2022-02-11 ENCOUNTER — Ambulatory Visit
Admission: RE | Admit: 2022-02-11 | Discharge: 2022-02-11 | Disposition: A | Discharge status: Home / Self Care | Payer: Medicare HMO | Source: Ambulatory Visit | Attending: Radiation Oncology | Admitting: Radiation Oncology

## 2022-02-11 ENCOUNTER — Other Ambulatory Visit: Payer: Self-pay

## 2022-02-11 DIAGNOSIS — Z191 Hormone sensitive malignancy status: Secondary | ICD-10-CM | POA: Diagnosis not present

## 2022-02-11 DIAGNOSIS — C61 Malignant neoplasm of prostate: Secondary | ICD-10-CM | POA: Diagnosis not present

## 2022-02-11 DIAGNOSIS — Z510 Encounter for antineoplastic radiation therapy: Secondary | ICD-10-CM | POA: Diagnosis not present

## 2022-02-11 LAB — RAD ONC ARIA SESSION SUMMARY
Course Elapsed Days: 0
Plan Fractions Treated to Date: 1
Plan Prescribed Dose Per Fraction: 2 Gy
Plan Total Fractions Prescribed: 40
Plan Total Prescribed Dose: 80 Gy
Reference Point Dosage Given to Date: 2 Gy
Reference Point Session Dosage Given: 2 Gy
Session Number: 1

## 2022-02-12 ENCOUNTER — Ambulatory Visit
Admission: RE | Admit: 2022-02-12 | Discharge: 2022-02-12 | Disposition: A | Discharge status: Home / Self Care | Payer: Medicare HMO | Source: Ambulatory Visit | Attending: Radiation Oncology | Admitting: Radiation Oncology

## 2022-02-12 ENCOUNTER — Other Ambulatory Visit: Payer: Self-pay

## 2022-02-12 DIAGNOSIS — Z191 Hormone sensitive malignancy status: Secondary | ICD-10-CM | POA: Diagnosis not present

## 2022-02-12 DIAGNOSIS — C61 Malignant neoplasm of prostate: Secondary | ICD-10-CM | POA: Diagnosis not present

## 2022-02-12 DIAGNOSIS — Z510 Encounter for antineoplastic radiation therapy: Secondary | ICD-10-CM | POA: Diagnosis not present

## 2022-02-12 LAB — RAD ONC ARIA SESSION SUMMARY
Course Elapsed Days: 1
Plan Fractions Treated to Date: 2
Plan Prescribed Dose Per Fraction: 2 Gy
Plan Total Fractions Prescribed: 40
Plan Total Prescribed Dose: 80 Gy
Reference Point Dosage Given to Date: 4 Gy
Reference Point Session Dosage Given: 2 Gy
Session Number: 2

## 2022-02-15 ENCOUNTER — Encounter: Payer: Medicare HMO | Admitting: Physical Therapy

## 2022-02-15 ENCOUNTER — Other Ambulatory Visit: Payer: Self-pay

## 2022-02-15 ENCOUNTER — Ambulatory Visit
Admission: RE | Admit: 2022-02-15 | Discharge: 2022-02-15 | Disposition: A | Discharge status: Home / Self Care | Payer: Medicare HMO | Source: Ambulatory Visit | Attending: Radiation Oncology | Admitting: Radiation Oncology

## 2022-02-15 DIAGNOSIS — Z510 Encounter for antineoplastic radiation therapy: Secondary | ICD-10-CM | POA: Diagnosis not present

## 2022-02-15 DIAGNOSIS — C61 Malignant neoplasm of prostate: Secondary | ICD-10-CM | POA: Diagnosis not present

## 2022-02-15 DIAGNOSIS — Z191 Hormone sensitive malignancy status: Secondary | ICD-10-CM | POA: Diagnosis not present

## 2022-02-15 LAB — RAD ONC ARIA SESSION SUMMARY
Course Elapsed Days: 4
Plan Fractions Treated to Date: 3
Plan Prescribed Dose Per Fraction: 2 Gy
Plan Total Fractions Prescribed: 40
Plan Total Prescribed Dose: 80 Gy
Reference Point Dosage Given to Date: 6 Gy
Reference Point Session Dosage Given: 2 Gy
Session Number: 3

## 2022-02-16 ENCOUNTER — Other Ambulatory Visit: Payer: Self-pay

## 2022-02-16 ENCOUNTER — Encounter: Payer: Medicare HMO | Admitting: Physical Therapy

## 2022-02-16 ENCOUNTER — Ambulatory Visit
Admission: RE | Admit: 2022-02-16 | Discharge: 2022-02-16 | Disposition: A | Discharge status: Home / Self Care | Payer: Medicare HMO | Source: Ambulatory Visit | Attending: Radiation Oncology | Admitting: Radiation Oncology

## 2022-02-16 DIAGNOSIS — C61 Malignant neoplasm of prostate: Secondary | ICD-10-CM | POA: Diagnosis not present

## 2022-02-16 DIAGNOSIS — Z191 Hormone sensitive malignancy status: Secondary | ICD-10-CM | POA: Diagnosis not present

## 2022-02-16 DIAGNOSIS — Z510 Encounter for antineoplastic radiation therapy: Secondary | ICD-10-CM | POA: Diagnosis not present

## 2022-02-16 LAB — RAD ONC ARIA SESSION SUMMARY
Course Elapsed Days: 5
Plan Fractions Treated to Date: 4
Plan Prescribed Dose Per Fraction: 2 Gy
Plan Total Fractions Prescribed: 40
Plan Total Prescribed Dose: 80 Gy
Reference Point Dosage Given to Date: 8 Gy
Reference Point Session Dosage Given: 2 Gy
Session Number: 4

## 2022-02-17 ENCOUNTER — Other Ambulatory Visit: Payer: Self-pay

## 2022-02-17 ENCOUNTER — Ambulatory Visit
Admission: RE | Admit: 2022-02-17 | Discharge: 2022-02-17 | Disposition: A | Discharge status: Home / Self Care | Payer: Medicare HMO | Source: Ambulatory Visit | Attending: Radiation Oncology | Admitting: Radiation Oncology

## 2022-02-17 DIAGNOSIS — Z191 Hormone sensitive malignancy status: Secondary | ICD-10-CM | POA: Diagnosis not present

## 2022-02-17 DIAGNOSIS — Z510 Encounter for antineoplastic radiation therapy: Secondary | ICD-10-CM | POA: Diagnosis not present

## 2022-02-17 DIAGNOSIS — C61 Malignant neoplasm of prostate: Secondary | ICD-10-CM | POA: Diagnosis not present

## 2022-02-17 LAB — RAD ONC ARIA SESSION SUMMARY
Course Elapsed Days: 6
Plan Fractions Treated to Date: 5
Plan Prescribed Dose Per Fraction: 2 Gy
Plan Total Fractions Prescribed: 40
Plan Total Prescribed Dose: 80 Gy
Reference Point Dosage Given to Date: 10 Gy
Reference Point Session Dosage Given: 2 Gy
Session Number: 5

## 2022-02-18 ENCOUNTER — Other Ambulatory Visit: Payer: Self-pay

## 2022-02-18 ENCOUNTER — Ambulatory Visit
Admission: RE | Admit: 2022-02-18 | Discharge: 2022-02-18 | Disposition: A | Discharge status: Home / Self Care | Payer: Medicare HMO | Source: Ambulatory Visit | Attending: Radiation Oncology | Admitting: Radiation Oncology

## 2022-02-18 DIAGNOSIS — Z510 Encounter for antineoplastic radiation therapy: Secondary | ICD-10-CM | POA: Diagnosis not present

## 2022-02-18 DIAGNOSIS — Z191 Hormone sensitive malignancy status: Secondary | ICD-10-CM | POA: Diagnosis not present

## 2022-02-18 DIAGNOSIS — C61 Malignant neoplasm of prostate: Secondary | ICD-10-CM | POA: Diagnosis not present

## 2022-02-18 LAB — RAD ONC ARIA SESSION SUMMARY
Course Elapsed Days: 7
Plan Fractions Treated to Date: 6
Plan Prescribed Dose Per Fraction: 2 Gy
Plan Total Fractions Prescribed: 40
Plan Total Prescribed Dose: 80 Gy
Reference Point Dosage Given to Date: 12 Gy
Reference Point Session Dosage Given: 2 Gy
Session Number: 6

## 2022-02-19 ENCOUNTER — Other Ambulatory Visit: Payer: Self-pay

## 2022-02-19 ENCOUNTER — Ambulatory Visit
Admission: RE | Admit: 2022-02-19 | Discharge: 2022-02-19 | Disposition: A | Discharge status: Home / Self Care | Payer: Medicare HMO | Source: Ambulatory Visit | Attending: Radiation Oncology | Admitting: Radiation Oncology

## 2022-02-19 DIAGNOSIS — Z510 Encounter for antineoplastic radiation therapy: Secondary | ICD-10-CM | POA: Diagnosis not present

## 2022-02-19 DIAGNOSIS — Z191 Hormone sensitive malignancy status: Secondary | ICD-10-CM | POA: Diagnosis not present

## 2022-02-19 DIAGNOSIS — C61 Malignant neoplasm of prostate: Secondary | ICD-10-CM | POA: Diagnosis not present

## 2022-02-19 LAB — RAD ONC ARIA SESSION SUMMARY
Course Elapsed Days: 8
Plan Fractions Treated to Date: 7
Plan Prescribed Dose Per Fraction: 2 Gy
Plan Total Fractions Prescribed: 40
Plan Total Prescribed Dose: 80 Gy
Reference Point Dosage Given to Date: 14 Gy
Reference Point Session Dosage Given: 2 Gy
Session Number: 7

## 2022-02-22 ENCOUNTER — Other Ambulatory Visit: Payer: Self-pay

## 2022-02-22 ENCOUNTER — Ambulatory Visit
Admission: RE | Admit: 2022-02-22 | Discharge: 2022-02-22 | Disposition: A | Discharge status: Home / Self Care | Payer: Medicare HMO | Source: Ambulatory Visit | Attending: Radiation Oncology | Admitting: Radiation Oncology

## 2022-02-22 ENCOUNTER — Encounter: Payer: Medicare HMO | Admitting: Physical Therapy

## 2022-02-22 DIAGNOSIS — C61 Malignant neoplasm of prostate: Secondary | ICD-10-CM | POA: Diagnosis not present

## 2022-02-22 DIAGNOSIS — Z510 Encounter for antineoplastic radiation therapy: Secondary | ICD-10-CM | POA: Diagnosis not present

## 2022-02-22 DIAGNOSIS — Z191 Hormone sensitive malignancy status: Secondary | ICD-10-CM | POA: Diagnosis not present

## 2022-02-22 LAB — RAD ONC ARIA SESSION SUMMARY
Course Elapsed Days: 11
Plan Fractions Treated to Date: 8
Plan Prescribed Dose Per Fraction: 2 Gy
Plan Total Fractions Prescribed: 40
Plan Total Prescribed Dose: 80 Gy
Reference Point Dosage Given to Date: 16 Gy
Reference Point Session Dosage Given: 2 Gy
Session Number: 8

## 2022-02-23 ENCOUNTER — Other Ambulatory Visit: Payer: Self-pay

## 2022-02-23 ENCOUNTER — Ambulatory Visit
Admission: RE | Admit: 2022-02-23 | Discharge: 2022-02-23 | Disposition: A | Discharge status: Home / Self Care | Payer: Medicare HMO | Source: Ambulatory Visit | Attending: Radiation Oncology | Admitting: Radiation Oncology

## 2022-02-23 DIAGNOSIS — Z191 Hormone sensitive malignancy status: Secondary | ICD-10-CM | POA: Diagnosis not present

## 2022-02-23 DIAGNOSIS — Z510 Encounter for antineoplastic radiation therapy: Secondary | ICD-10-CM | POA: Diagnosis not present

## 2022-02-23 DIAGNOSIS — C61 Malignant neoplasm of prostate: Secondary | ICD-10-CM | POA: Diagnosis not present

## 2022-02-23 LAB — RAD ONC ARIA SESSION SUMMARY
Course Elapsed Days: 12
Plan Fractions Treated to Date: 9
Plan Prescribed Dose Per Fraction: 2 Gy
Plan Total Fractions Prescribed: 40
Plan Total Prescribed Dose: 80 Gy
Reference Point Dosage Given to Date: 18 Gy
Reference Point Session Dosage Given: 2 Gy
Session Number: 9

## 2022-02-24 ENCOUNTER — Other Ambulatory Visit: Payer: Self-pay

## 2022-02-24 ENCOUNTER — Inpatient Hospital Stay: Payer: Medicare HMO | Attending: Radiation Oncology

## 2022-02-24 ENCOUNTER — Ambulatory Visit
Admission: RE | Admit: 2022-02-24 | Discharge: 2022-02-24 | Disposition: A | Discharge status: Home / Self Care | Payer: Medicare HMO | Source: Ambulatory Visit | Attending: Radiation Oncology | Admitting: Radiation Oncology

## 2022-02-24 DIAGNOSIS — C61 Malignant neoplasm of prostate: Secondary | ICD-10-CM

## 2022-02-24 DIAGNOSIS — Z191 Hormone sensitive malignancy status: Secondary | ICD-10-CM | POA: Diagnosis not present

## 2022-02-24 DIAGNOSIS — Z510 Encounter for antineoplastic radiation therapy: Secondary | ICD-10-CM | POA: Diagnosis not present

## 2022-02-24 LAB — RAD ONC ARIA SESSION SUMMARY
Course Elapsed Days: 13
Plan Fractions Treated to Date: 10
Plan Prescribed Dose Per Fraction: 2 Gy
Plan Total Fractions Prescribed: 40
Plan Total Prescribed Dose: 80 Gy
Reference Point Dosage Given to Date: 20 Gy
Reference Point Session Dosage Given: 2 Gy
Session Number: 10

## 2022-02-24 LAB — CBC
HCT: 39.3 % (ref 39.0–52.0)
Hemoglobin: 13.7 g/dL (ref 13.0–17.0)
MCH: 31.1 pg (ref 26.0–34.0)
MCHC: 34.9 g/dL (ref 30.0–36.0)
MCV: 89.1 fL (ref 80.0–100.0)
Platelets: 230 10*3/uL (ref 150–400)
RBC: 4.41 MIL/uL (ref 4.22–5.81)
RDW: 11.9 % (ref 11.5–15.5)
WBC: 2.8 10*3/uL — ABNORMAL LOW (ref 4.0–10.5)
nRBC: 0 % (ref 0.0–0.2)

## 2022-02-25 ENCOUNTER — Other Ambulatory Visit: Payer: Self-pay

## 2022-02-25 ENCOUNTER — Ambulatory Visit
Admission: RE | Admit: 2022-02-25 | Discharge: 2022-02-25 | Disposition: A | Discharge status: Home / Self Care | Payer: Medicare HMO | Source: Ambulatory Visit | Attending: Radiation Oncology | Admitting: Radiation Oncology

## 2022-02-25 DIAGNOSIS — C61 Malignant neoplasm of prostate: Secondary | ICD-10-CM | POA: Diagnosis not present

## 2022-02-25 DIAGNOSIS — Z191 Hormone sensitive malignancy status: Secondary | ICD-10-CM | POA: Diagnosis not present

## 2022-02-25 DIAGNOSIS — Z510 Encounter for antineoplastic radiation therapy: Secondary | ICD-10-CM | POA: Diagnosis not present

## 2022-02-25 LAB — RAD ONC ARIA SESSION SUMMARY
Course Elapsed Days: 14
Plan Fractions Treated to Date: 11
Plan Prescribed Dose Per Fraction: 2 Gy
Plan Total Fractions Prescribed: 40
Plan Total Prescribed Dose: 80 Gy
Reference Point Dosage Given to Date: 22 Gy
Reference Point Session Dosage Given: 2 Gy
Session Number: 11

## 2022-02-26 ENCOUNTER — Other Ambulatory Visit: Payer: Self-pay

## 2022-02-26 ENCOUNTER — Ambulatory Visit
Admission: RE | Admit: 2022-02-26 | Discharge: 2022-02-26 | Disposition: A | Discharge status: Home / Self Care | Payer: Medicare HMO | Source: Ambulatory Visit | Attending: Radiation Oncology | Admitting: Radiation Oncology

## 2022-02-26 DIAGNOSIS — Z191 Hormone sensitive malignancy status: Secondary | ICD-10-CM | POA: Diagnosis not present

## 2022-02-26 DIAGNOSIS — C61 Malignant neoplasm of prostate: Secondary | ICD-10-CM | POA: Diagnosis not present

## 2022-02-26 DIAGNOSIS — Z510 Encounter for antineoplastic radiation therapy: Secondary | ICD-10-CM | POA: Diagnosis not present

## 2022-02-26 LAB — RAD ONC ARIA SESSION SUMMARY
Course Elapsed Days: 15
Plan Fractions Treated to Date: 12
Plan Prescribed Dose Per Fraction: 2 Gy
Plan Total Fractions Prescribed: 40
Plan Total Prescribed Dose: 80 Gy
Reference Point Dosage Given to Date: 24 Gy
Reference Point Session Dosage Given: 2 Gy
Session Number: 12

## 2022-03-01 ENCOUNTER — Other Ambulatory Visit: Payer: Self-pay

## 2022-03-01 ENCOUNTER — Encounter: Payer: Medicare HMO | Admitting: Physical Therapy

## 2022-03-01 ENCOUNTER — Ambulatory Visit
Admission: RE | Admit: 2022-03-01 | Discharge: 2022-03-01 | Disposition: A | Discharge status: Home / Self Care | Payer: Medicare HMO | Source: Ambulatory Visit | Attending: Radiation Oncology | Admitting: Radiation Oncology

## 2022-03-01 DIAGNOSIS — C61 Malignant neoplasm of prostate: Secondary | ICD-10-CM | POA: Diagnosis not present

## 2022-03-01 DIAGNOSIS — Z510 Encounter for antineoplastic radiation therapy: Secondary | ICD-10-CM | POA: Diagnosis not present

## 2022-03-01 DIAGNOSIS — Z191 Hormone sensitive malignancy status: Secondary | ICD-10-CM | POA: Diagnosis not present

## 2022-03-01 LAB — RAD ONC ARIA SESSION SUMMARY
Course Elapsed Days: 18
Plan Fractions Treated to Date: 13
Plan Prescribed Dose Per Fraction: 2 Gy
Plan Total Fractions Prescribed: 40
Plan Total Prescribed Dose: 80 Gy
Reference Point Dosage Given to Date: 26 Gy
Reference Point Session Dosage Given: 2 Gy
Session Number: 13

## 2022-03-02 ENCOUNTER — Other Ambulatory Visit: Payer: Self-pay

## 2022-03-02 ENCOUNTER — Ambulatory Visit
Admission: RE | Admit: 2022-03-02 | Discharge: 2022-03-02 | Disposition: A | Discharge status: Home / Self Care | Payer: Medicare HMO | Source: Ambulatory Visit | Attending: Radiation Oncology | Admitting: Radiation Oncology

## 2022-03-02 DIAGNOSIS — C61 Malignant neoplasm of prostate: Secondary | ICD-10-CM | POA: Diagnosis not present

## 2022-03-02 DIAGNOSIS — Z191 Hormone sensitive malignancy status: Secondary | ICD-10-CM | POA: Diagnosis not present

## 2022-03-02 DIAGNOSIS — Z510 Encounter for antineoplastic radiation therapy: Secondary | ICD-10-CM | POA: Diagnosis not present

## 2022-03-02 LAB — RAD ONC ARIA SESSION SUMMARY
Course Elapsed Days: 19
Plan Fractions Treated to Date: 14
Plan Prescribed Dose Per Fraction: 2 Gy
Plan Total Fractions Prescribed: 40
Plan Total Prescribed Dose: 80 Gy
Reference Point Dosage Given to Date: 28 Gy
Reference Point Session Dosage Given: 2 Gy
Session Number: 14

## 2022-03-03 ENCOUNTER — Ambulatory Visit
Admission: RE | Admit: 2022-03-03 | Discharge: 2022-03-03 | Disposition: A | Discharge status: Home / Self Care | Payer: Medicare HMO | Source: Ambulatory Visit | Attending: Radiation Oncology | Admitting: Radiation Oncology

## 2022-03-03 ENCOUNTER — Other Ambulatory Visit: Payer: Self-pay

## 2022-03-03 DIAGNOSIS — C61 Malignant neoplasm of prostate: Secondary | ICD-10-CM | POA: Diagnosis not present

## 2022-03-03 DIAGNOSIS — Z191 Hormone sensitive malignancy status: Secondary | ICD-10-CM | POA: Diagnosis not present

## 2022-03-03 DIAGNOSIS — Z510 Encounter for antineoplastic radiation therapy: Secondary | ICD-10-CM | POA: Diagnosis not present

## 2022-03-03 LAB — RAD ONC ARIA SESSION SUMMARY
Course Elapsed Days: 20
Plan Fractions Treated to Date: 15
Plan Prescribed Dose Per Fraction: 2 Gy
Plan Total Fractions Prescribed: 40
Plan Total Prescribed Dose: 80 Gy
Reference Point Dosage Given to Date: 30 Gy
Reference Point Session Dosage Given: 2 Gy
Session Number: 15

## 2022-03-04 ENCOUNTER — Other Ambulatory Visit: Payer: Self-pay

## 2022-03-04 ENCOUNTER — Ambulatory Visit
Admission: RE | Admit: 2022-03-04 | Discharge: 2022-03-04 | Disposition: A | Discharge status: Home / Self Care | Payer: Medicare HMO | Source: Ambulatory Visit | Attending: Radiation Oncology | Admitting: Radiation Oncology

## 2022-03-04 DIAGNOSIS — Z191 Hormone sensitive malignancy status: Secondary | ICD-10-CM | POA: Diagnosis not present

## 2022-03-04 DIAGNOSIS — Z510 Encounter for antineoplastic radiation therapy: Secondary | ICD-10-CM | POA: Diagnosis not present

## 2022-03-04 DIAGNOSIS — C61 Malignant neoplasm of prostate: Secondary | ICD-10-CM | POA: Diagnosis not present

## 2022-03-04 LAB — RAD ONC ARIA SESSION SUMMARY
Course Elapsed Days: 21
Plan Fractions Treated to Date: 16
Plan Prescribed Dose Per Fraction: 2 Gy
Plan Total Fractions Prescribed: 40
Plan Total Prescribed Dose: 80 Gy
Reference Point Dosage Given to Date: 32 Gy
Reference Point Session Dosage Given: 2 Gy
Session Number: 16

## 2022-03-05 ENCOUNTER — Ambulatory Visit
Admission: RE | Admit: 2022-03-05 | Discharge: 2022-03-05 | Disposition: A | Discharge status: Home / Self Care | Payer: Medicare HMO | Source: Ambulatory Visit | Attending: Radiation Oncology | Admitting: Radiation Oncology

## 2022-03-05 ENCOUNTER — Other Ambulatory Visit: Payer: Self-pay

## 2022-03-05 DIAGNOSIS — Z510 Encounter for antineoplastic radiation therapy: Secondary | ICD-10-CM | POA: Diagnosis not present

## 2022-03-05 DIAGNOSIS — C61 Malignant neoplasm of prostate: Secondary | ICD-10-CM | POA: Insufficient documentation

## 2022-03-05 DIAGNOSIS — Z191 Hormone sensitive malignancy status: Secondary | ICD-10-CM | POA: Diagnosis not present

## 2022-03-05 LAB — RAD ONC ARIA SESSION SUMMARY
Course Elapsed Days: 22
Plan Fractions Treated to Date: 17
Plan Prescribed Dose Per Fraction: 2 Gy
Plan Total Fractions Prescribed: 40
Plan Total Prescribed Dose: 80 Gy
Reference Point Dosage Given to Date: 34 Gy
Reference Point Session Dosage Given: 2 Gy
Session Number: 17

## 2022-03-09 ENCOUNTER — Ambulatory Visit
Admission: RE | Admit: 2022-03-09 | Discharge: 2022-03-09 | Disposition: A | Discharge status: Home / Self Care | Payer: Medicare HMO | Source: Ambulatory Visit | Attending: Radiation Oncology | Admitting: Radiation Oncology

## 2022-03-09 ENCOUNTER — Other Ambulatory Visit: Payer: Self-pay

## 2022-03-09 DIAGNOSIS — Z510 Encounter for antineoplastic radiation therapy: Secondary | ICD-10-CM | POA: Diagnosis not present

## 2022-03-09 DIAGNOSIS — C61 Malignant neoplasm of prostate: Secondary | ICD-10-CM | POA: Diagnosis not present

## 2022-03-09 DIAGNOSIS — Z191 Hormone sensitive malignancy status: Secondary | ICD-10-CM | POA: Diagnosis not present

## 2022-03-09 LAB — RAD ONC ARIA SESSION SUMMARY
Course Elapsed Days: 26
Plan Fractions Treated to Date: 18
Plan Prescribed Dose Per Fraction: 2 Gy
Plan Total Fractions Prescribed: 40
Plan Total Prescribed Dose: 80 Gy
Reference Point Dosage Given to Date: 36 Gy
Reference Point Session Dosage Given: 2 Gy
Session Number: 18

## 2022-03-10 ENCOUNTER — Ambulatory Visit
Admission: RE | Admit: 2022-03-10 | Discharge: 2022-03-10 | Disposition: A | Discharge status: Home / Self Care | Payer: Medicare HMO | Source: Ambulatory Visit | Attending: Radiation Oncology | Admitting: Radiation Oncology

## 2022-03-10 ENCOUNTER — Other Ambulatory Visit: Payer: Self-pay

## 2022-03-10 ENCOUNTER — Inpatient Hospital Stay: Payer: Medicare HMO | Attending: Radiation Oncology

## 2022-03-10 DIAGNOSIS — C61 Malignant neoplasm of prostate: Secondary | ICD-10-CM | POA: Diagnosis not present

## 2022-03-10 DIAGNOSIS — Z510 Encounter for antineoplastic radiation therapy: Secondary | ICD-10-CM | POA: Diagnosis not present

## 2022-03-10 DIAGNOSIS — Z191 Hormone sensitive malignancy status: Secondary | ICD-10-CM | POA: Diagnosis not present

## 2022-03-10 LAB — RAD ONC ARIA SESSION SUMMARY
Course Elapsed Days: 27
Plan Fractions Treated to Date: 19
Plan Prescribed Dose Per Fraction: 2 Gy
Plan Total Fractions Prescribed: 40
Plan Total Prescribed Dose: 80 Gy
Reference Point Dosage Given to Date: 38 Gy
Reference Point Session Dosage Given: 2 Gy
Session Number: 19

## 2022-03-10 LAB — CBC
HCT: 38.7 % — ABNORMAL LOW (ref 39.0–52.0)
Hemoglobin: 13.4 g/dL (ref 13.0–17.0)
MCH: 31 pg (ref 26.0–34.0)
MCHC: 34.6 g/dL (ref 30.0–36.0)
MCV: 89.6 fL (ref 80.0–100.0)
Platelets: 165 10*3/uL (ref 150–400)
RBC: 4.32 MIL/uL (ref 4.22–5.81)
RDW: 12.2 % (ref 11.5–15.5)
WBC: 2.8 10*3/uL — ABNORMAL LOW (ref 4.0–10.5)
nRBC: 0 % (ref 0.0–0.2)

## 2022-03-11 ENCOUNTER — Ambulatory Visit
Admission: RE | Admit: 2022-03-11 | Discharge: 2022-03-11 | Disposition: A | Discharge status: Home / Self Care | Payer: Medicare HMO | Source: Ambulatory Visit | Attending: Radiation Oncology | Admitting: Radiation Oncology

## 2022-03-11 ENCOUNTER — Other Ambulatory Visit: Payer: Self-pay

## 2022-03-11 DIAGNOSIS — C61 Malignant neoplasm of prostate: Secondary | ICD-10-CM | POA: Diagnosis not present

## 2022-03-11 DIAGNOSIS — Z510 Encounter for antineoplastic radiation therapy: Secondary | ICD-10-CM | POA: Diagnosis not present

## 2022-03-11 DIAGNOSIS — Z191 Hormone sensitive malignancy status: Secondary | ICD-10-CM | POA: Diagnosis not present

## 2022-03-11 LAB — RAD ONC ARIA SESSION SUMMARY
Course Elapsed Days: 28
Plan Fractions Treated to Date: 20
Plan Prescribed Dose Per Fraction: 2 Gy
Plan Total Fractions Prescribed: 40
Plan Total Prescribed Dose: 80 Gy
Reference Point Dosage Given to Date: 40 Gy
Reference Point Session Dosage Given: 2 Gy
Session Number: 20

## 2022-03-12 ENCOUNTER — Other Ambulatory Visit: Payer: Self-pay

## 2022-03-12 ENCOUNTER — Ambulatory Visit
Admission: RE | Admit: 2022-03-12 | Discharge: 2022-03-12 | Disposition: A | Discharge status: Home / Self Care | Payer: Medicare HMO | Source: Ambulatory Visit | Attending: Radiation Oncology | Admitting: Radiation Oncology

## 2022-03-12 DIAGNOSIS — Z191 Hormone sensitive malignancy status: Secondary | ICD-10-CM | POA: Diagnosis not present

## 2022-03-12 DIAGNOSIS — Z510 Encounter for antineoplastic radiation therapy: Secondary | ICD-10-CM | POA: Diagnosis not present

## 2022-03-12 DIAGNOSIS — C61 Malignant neoplasm of prostate: Secondary | ICD-10-CM | POA: Diagnosis not present

## 2022-03-12 LAB — RAD ONC ARIA SESSION SUMMARY
Course Elapsed Days: 29
Plan Fractions Treated to Date: 21
Plan Prescribed Dose Per Fraction: 2 Gy
Plan Total Fractions Prescribed: 40
Plan Total Prescribed Dose: 80 Gy
Reference Point Dosage Given to Date: 42 Gy
Reference Point Session Dosage Given: 2 Gy
Session Number: 21

## 2022-03-15 ENCOUNTER — Ambulatory Visit
Admission: RE | Admit: 2022-03-15 | Discharge: 2022-03-15 | Disposition: A | Discharge status: Home / Self Care | Payer: Medicare HMO | Source: Ambulatory Visit | Attending: Radiation Oncology | Admitting: Radiation Oncology

## 2022-03-15 ENCOUNTER — Other Ambulatory Visit: Payer: Self-pay

## 2022-03-15 DIAGNOSIS — Z191 Hormone sensitive malignancy status: Secondary | ICD-10-CM | POA: Diagnosis not present

## 2022-03-15 DIAGNOSIS — Z510 Encounter for antineoplastic radiation therapy: Secondary | ICD-10-CM | POA: Diagnosis not present

## 2022-03-15 DIAGNOSIS — C61 Malignant neoplasm of prostate: Secondary | ICD-10-CM | POA: Diagnosis not present

## 2022-03-15 LAB — RAD ONC ARIA SESSION SUMMARY
Course Elapsed Days: 32
Plan Fractions Treated to Date: 22
Plan Prescribed Dose Per Fraction: 2 Gy
Plan Total Fractions Prescribed: 40
Plan Total Prescribed Dose: 80 Gy
Reference Point Dosage Given to Date: 44 Gy
Reference Point Session Dosage Given: 2 Gy
Session Number: 22

## 2022-03-16 ENCOUNTER — Ambulatory Visit
Admission: RE | Admit: 2022-03-16 | Discharge: 2022-03-16 | Disposition: A | Discharge status: Home / Self Care | Payer: Medicare HMO | Source: Ambulatory Visit | Attending: Radiation Oncology | Admitting: Radiation Oncology

## 2022-03-16 ENCOUNTER — Other Ambulatory Visit: Payer: Self-pay

## 2022-03-16 DIAGNOSIS — Z191 Hormone sensitive malignancy status: Secondary | ICD-10-CM | POA: Diagnosis not present

## 2022-03-16 DIAGNOSIS — C61 Malignant neoplasm of prostate: Secondary | ICD-10-CM | POA: Diagnosis not present

## 2022-03-16 DIAGNOSIS — Z510 Encounter for antineoplastic radiation therapy: Secondary | ICD-10-CM | POA: Diagnosis not present

## 2022-03-16 LAB — RAD ONC ARIA SESSION SUMMARY
Course Elapsed Days: 33
Plan Fractions Treated to Date: 23
Plan Prescribed Dose Per Fraction: 2 Gy
Plan Total Fractions Prescribed: 40
Plan Total Prescribed Dose: 80 Gy
Reference Point Dosage Given to Date: 46 Gy
Reference Point Session Dosage Given: 2 Gy
Session Number: 23

## 2022-03-17 ENCOUNTER — Other Ambulatory Visit: Payer: Self-pay

## 2022-03-17 ENCOUNTER — Ambulatory Visit
Admission: RE | Admit: 2022-03-17 | Discharge: 2022-03-17 | Disposition: A | Discharge status: Home / Self Care | Payer: Medicare HMO | Source: Ambulatory Visit | Attending: Radiation Oncology | Admitting: Radiation Oncology

## 2022-03-17 DIAGNOSIS — C61 Malignant neoplasm of prostate: Secondary | ICD-10-CM | POA: Diagnosis not present

## 2022-03-17 DIAGNOSIS — Z191 Hormone sensitive malignancy status: Secondary | ICD-10-CM | POA: Diagnosis not present

## 2022-03-17 DIAGNOSIS — Z510 Encounter for antineoplastic radiation therapy: Secondary | ICD-10-CM | POA: Diagnosis not present

## 2022-03-17 LAB — RAD ONC ARIA SESSION SUMMARY
Course Elapsed Days: 34
Plan Fractions Treated to Date: 24
Plan Prescribed Dose Per Fraction: 2 Gy
Plan Total Fractions Prescribed: 40
Plan Total Prescribed Dose: 80 Gy
Reference Point Dosage Given to Date: 48 Gy
Reference Point Session Dosage Given: 2 Gy
Session Number: 24

## 2022-03-18 ENCOUNTER — Ambulatory Visit
Admission: RE | Admit: 2022-03-18 | Discharge: 2022-03-18 | Disposition: A | Discharge status: Home / Self Care | Payer: Medicare HMO | Source: Ambulatory Visit | Attending: Unknown Physician Specialty | Admitting: Unknown Physician Specialty

## 2022-03-18 ENCOUNTER — Ambulatory Visit: Admission: RE | Admit: 2022-03-18 | Payer: Medicare HMO | Source: Ambulatory Visit

## 2022-03-18 ENCOUNTER — Other Ambulatory Visit: Payer: Self-pay

## 2022-03-18 DIAGNOSIS — E041 Nontoxic single thyroid nodule: Secondary | ICD-10-CM | POA: Diagnosis not present

## 2022-03-18 DIAGNOSIS — Z510 Encounter for antineoplastic radiation therapy: Secondary | ICD-10-CM | POA: Diagnosis not present

## 2022-03-18 DIAGNOSIS — Z191 Hormone sensitive malignancy status: Secondary | ICD-10-CM | POA: Diagnosis not present

## 2022-03-18 DIAGNOSIS — C61 Malignant neoplasm of prostate: Secondary | ICD-10-CM | POA: Diagnosis not present

## 2022-03-18 LAB — RAD ONC ARIA SESSION SUMMARY
Course Elapsed Days: 35
Plan Fractions Treated to Date: 25
Plan Prescribed Dose Per Fraction: 2 Gy
Plan Total Fractions Prescribed: 40
Plan Total Prescribed Dose: 80 Gy
Reference Point Dosage Given to Date: 50 Gy
Reference Point Session Dosage Given: 2 Gy
Session Number: 25

## 2022-03-19 ENCOUNTER — Other Ambulatory Visit: Payer: Self-pay

## 2022-03-19 ENCOUNTER — Ambulatory Visit
Admission: RE | Admit: 2022-03-19 | Discharge: 2022-03-19 | Disposition: A | Discharge status: Home / Self Care | Payer: Medicare HMO | Source: Ambulatory Visit | Attending: Radiation Oncology | Admitting: Radiation Oncology

## 2022-03-19 DIAGNOSIS — Z510 Encounter for antineoplastic radiation therapy: Secondary | ICD-10-CM | POA: Diagnosis not present

## 2022-03-19 DIAGNOSIS — Z191 Hormone sensitive malignancy status: Secondary | ICD-10-CM | POA: Diagnosis not present

## 2022-03-19 DIAGNOSIS — C61 Malignant neoplasm of prostate: Secondary | ICD-10-CM | POA: Diagnosis not present

## 2022-03-19 LAB — RAD ONC ARIA SESSION SUMMARY
Course Elapsed Days: 36
Plan Fractions Treated to Date: 26
Plan Prescribed Dose Per Fraction: 2 Gy
Plan Total Fractions Prescribed: 40
Plan Total Prescribed Dose: 80 Gy
Reference Point Dosage Given to Date: 52 Gy
Reference Point Session Dosage Given: 2 Gy
Session Number: 26

## 2022-03-22 ENCOUNTER — Other Ambulatory Visit: Payer: Self-pay

## 2022-03-22 ENCOUNTER — Ambulatory Visit
Admission: RE | Admit: 2022-03-22 | Discharge: 2022-03-22 | Disposition: A | Discharge status: Home / Self Care | Payer: Medicare HMO | Source: Ambulatory Visit | Attending: Radiation Oncology | Admitting: Radiation Oncology

## 2022-03-22 DIAGNOSIS — C61 Malignant neoplasm of prostate: Secondary | ICD-10-CM | POA: Diagnosis not present

## 2022-03-22 DIAGNOSIS — Z191 Hormone sensitive malignancy status: Secondary | ICD-10-CM | POA: Diagnosis not present

## 2022-03-22 DIAGNOSIS — Z510 Encounter for antineoplastic radiation therapy: Secondary | ICD-10-CM | POA: Diagnosis not present

## 2022-03-22 LAB — RAD ONC ARIA SESSION SUMMARY
Course Elapsed Days: 39
Plan Fractions Treated to Date: 27
Plan Prescribed Dose Per Fraction: 2 Gy
Plan Total Fractions Prescribed: 40
Plan Total Prescribed Dose: 80 Gy
Reference Point Dosage Given to Date: 54 Gy
Reference Point Session Dosage Given: 2 Gy
Session Number: 27

## 2022-03-23 ENCOUNTER — Ambulatory Visit
Admission: RE | Admit: 2022-03-23 | Discharge: 2022-03-23 | Disposition: A | Discharge status: Home / Self Care | Payer: Medicare HMO | Source: Ambulatory Visit | Attending: Radiation Oncology | Admitting: Radiation Oncology

## 2022-03-23 ENCOUNTER — Other Ambulatory Visit: Payer: Self-pay

## 2022-03-23 DIAGNOSIS — Z510 Encounter for antineoplastic radiation therapy: Secondary | ICD-10-CM | POA: Diagnosis not present

## 2022-03-23 DIAGNOSIS — Z191 Hormone sensitive malignancy status: Secondary | ICD-10-CM | POA: Diagnosis not present

## 2022-03-23 DIAGNOSIS — C61 Malignant neoplasm of prostate: Secondary | ICD-10-CM | POA: Diagnosis not present

## 2022-03-23 LAB — RAD ONC ARIA SESSION SUMMARY
Course Elapsed Days: 40
Plan Fractions Treated to Date: 28
Plan Prescribed Dose Per Fraction: 2 Gy
Plan Total Fractions Prescribed: 40
Plan Total Prescribed Dose: 80 Gy
Reference Point Dosage Given to Date: 56 Gy
Reference Point Session Dosage Given: 2 Gy
Session Number: 28

## 2022-03-24 ENCOUNTER — Ambulatory Visit
Admission: RE | Admit: 2022-03-24 | Discharge: 2022-03-24 | Disposition: A | Discharge status: Home / Self Care | Payer: Medicare HMO | Source: Ambulatory Visit | Attending: Radiation Oncology | Admitting: Radiation Oncology

## 2022-03-24 ENCOUNTER — Inpatient Hospital Stay: Payer: Medicare HMO

## 2022-03-24 ENCOUNTER — Other Ambulatory Visit: Payer: Self-pay

## 2022-03-24 DIAGNOSIS — C61 Malignant neoplasm of prostate: Secondary | ICD-10-CM

## 2022-03-24 DIAGNOSIS — Z510 Encounter for antineoplastic radiation therapy: Secondary | ICD-10-CM | POA: Diagnosis not present

## 2022-03-24 DIAGNOSIS — Z191 Hormone sensitive malignancy status: Secondary | ICD-10-CM | POA: Diagnosis not present

## 2022-03-24 LAB — RAD ONC ARIA SESSION SUMMARY
Course Elapsed Days: 41
Plan Fractions Treated to Date: 29
Plan Prescribed Dose Per Fraction: 2 Gy
Plan Total Fractions Prescribed: 40
Plan Total Prescribed Dose: 80 Gy
Reference Point Dosage Given to Date: 58 Gy
Reference Point Session Dosage Given: 2 Gy
Session Number: 29

## 2022-03-24 LAB — CBC
HCT: 38.5 % — ABNORMAL LOW (ref 39.0–52.0)
Hemoglobin: 13.4 g/dL (ref 13.0–17.0)
MCH: 31.1 pg (ref 26.0–34.0)
MCHC: 34.8 g/dL (ref 30.0–36.0)
MCV: 89.3 fL (ref 80.0–100.0)
Platelets: 245 10*3/uL (ref 150–400)
RBC: 4.31 MIL/uL (ref 4.22–5.81)
RDW: 12.6 % (ref 11.5–15.5)
WBC: 2.2 10*3/uL — ABNORMAL LOW (ref 4.0–10.5)
nRBC: 0 % (ref 0.0–0.2)

## 2022-03-25 ENCOUNTER — Other Ambulatory Visit: Payer: Self-pay | Admitting: Unknown Physician Specialty

## 2022-03-25 ENCOUNTER — Ambulatory Visit
Admission: RE | Admit: 2022-03-25 | Discharge: 2022-03-25 | Disposition: A | Discharge status: Home / Self Care | Payer: Medicare HMO | Source: Ambulatory Visit | Attending: Radiation Oncology | Admitting: Radiation Oncology

## 2022-03-25 ENCOUNTER — Other Ambulatory Visit: Payer: Self-pay

## 2022-03-25 DIAGNOSIS — E041 Nontoxic single thyroid nodule: Secondary | ICD-10-CM

## 2022-03-25 DIAGNOSIS — Z510 Encounter for antineoplastic radiation therapy: Secondary | ICD-10-CM | POA: Diagnosis not present

## 2022-03-25 DIAGNOSIS — Z191 Hormone sensitive malignancy status: Secondary | ICD-10-CM | POA: Diagnosis not present

## 2022-03-25 DIAGNOSIS — C61 Malignant neoplasm of prostate: Secondary | ICD-10-CM | POA: Diagnosis not present

## 2022-03-25 LAB — RAD ONC ARIA SESSION SUMMARY
Course Elapsed Days: 42
Plan Fractions Treated to Date: 30
Plan Prescribed Dose Per Fraction: 2 Gy
Plan Total Fractions Prescribed: 40
Plan Total Prescribed Dose: 80 Gy
Reference Point Dosage Given to Date: 60 Gy
Reference Point Session Dosage Given: 2 Gy
Session Number: 30

## 2022-03-26 ENCOUNTER — Ambulatory Visit: Payer: Self-pay

## 2022-03-26 ENCOUNTER — Other Ambulatory Visit: Payer: Self-pay

## 2022-03-26 ENCOUNTER — Ambulatory Visit
Admission: RE | Admit: 2022-03-26 | Discharge: 2022-03-26 | Disposition: A | Discharge status: Home / Self Care | Payer: Medicare HMO | Source: Ambulatory Visit | Attending: Radiation Oncology | Admitting: Radiation Oncology

## 2022-03-26 DIAGNOSIS — Z510 Encounter for antineoplastic radiation therapy: Secondary | ICD-10-CM | POA: Diagnosis not present

## 2022-03-26 DIAGNOSIS — C61 Malignant neoplasm of prostate: Secondary | ICD-10-CM | POA: Diagnosis not present

## 2022-03-26 DIAGNOSIS — Z191 Hormone sensitive malignancy status: Secondary | ICD-10-CM | POA: Diagnosis not present

## 2022-03-26 LAB — RAD ONC ARIA SESSION SUMMARY
Course Elapsed Days: 43
Plan Fractions Treated to Date: 31
Plan Prescribed Dose Per Fraction: 2 Gy
Plan Total Fractions Prescribed: 40
Plan Total Prescribed Dose: 80 Gy
Reference Point Dosage Given to Date: 62 Gy
Reference Point Session Dosage Given: 2 Gy
Session Number: 31

## 2022-03-26 NOTE — Patient Outreach (Signed)
  Care Coordination   Initial Visit Note   03/26/2022 Name: William Duffy MRN: [id number] DOB: [birth year]  William Duffy is a 66 y.o. year old male who sees Dion Body, MD for primary care. I spoke with  [name] by phone today.  What matters to the patients health and wellness today?  Completing treatment for prostate cancer and condition stable    Goals Addressed             This Visit's Progress    RNCM: Effective Management of Prostate Cancer       Care Coordination Interventions: Assessed patient understanding of cancer diagnosis and recommended treatment plan. The patient has 9 more radiation treatments and his radiation treatment will be completed for his prostate cancer. He is doing well since surgery except it is hard to keep his flow under control. He has a clamp but this is very uncomfortable and he does not wear it often. The patient was at the gym working out post treatment today. He goes 4 days a week to work out and is there 45 minutes. He walks daily and usually walks about 70 minutes.  Reviewed upcoming provider appointments and treatment appointments. Knows his appointments. No concerns with making appointments.  Assessed available transportation to appointments and treatments. Has consistent/reliable transportation: Yes Assessed support system. Has consistent/reliable family or other support: Yes Nutrition assessment performed The patient agrees to outreaches from the [facility]. The patient has the [facility] number to call for new questions, concerns, or needs. Review of the care coordination program and resources available.            SDOH assessments and interventions completed:  Yes  SDOH Interventions Today    Flowsheet Row Most Recent Value  SDOH Interventions   Food Insecurity Interventions Intervention Not Indicated  Housing Interventions Intervention Not Indicated  Transportation Interventions Intervention Not Indicated  Utilities  Interventions Intervention Not Indicated  Alcohol Usage Interventions Intervention Not Indicated (Score <7)  Financial Strain Interventions Intervention Not Indicated  Physical Activity Interventions Intervention Not Indicated  Stress Interventions Intervention Not Indicated  Social Connections Interventions Intervention Not Indicated        Care Coordination Interventions Activated:  Yes  Care Coordination Interventions:  Yes, provided   Follow up plan: Follow up call scheduled for 06-18-2022 at 1130 am    Encounter Outcome:  Pt. Visit Completed   Noreene Larsson RN, MSN, [place]  Mobile: [phone]

## 2022-03-26 NOTE — Patient Instructions (Addendum)
Visit Information  Thank you for taking time to visit with me today. Please don't hesitate to contact me if I can be of assistance to you.   Following are the goals we discussed today:   Goals Addressed             This Visit's Progress    RNCM: Effective Management of Prostate Cancer       Care Coordination Interventions: Assessed patient understanding of cancer diagnosis and recommended treatment plan. The patient has 9 more radiation treatments and his radiation treatment will be completed for his prostate cancer. He is doing well since surgery except it is hard to keep his flow under control. He has a clamp but this is very uncomfortable and he does not wear it often. The patient was at the gym working out post treatment today. He goes 4 days a week to work out and is there 45 minutes. He walks daily and usually walks about 70 minutes.  Reviewed upcoming provider appointments and treatment appointments. Knows his appointments. No concerns with making appointments.  Assessed available transportation to appointments and treatments. Has consistent/reliable transportation: Yes Assessed support system. Has consistent/reliable family or other support: Yes Nutrition assessment performed The patient agrees to outreaches from the [facility]. The patient has the [facility] number to call for new questions, concerns, or needs. Review of the care coordination program and resources available.            Our next appointment is by telephone on 06-18-2022 at 1130 am  Please call the care guide team at [phone] if you need to cancel or reschedule your appointment.   If you are experiencing a Mental Health or [place] or need someone to talk to, please call the Suicide and Crisis Lifeline: 988 call the [place] National Suicide Prevention Lifeline: 6613774692 or TTY: 712-670-8714 TTY (787) 175-6681) to talk to a trained counselor call 1-800-273-TALK (toll free, 24 hour hotline)  Patient  verbalizes understanding of instructions and care plan provided today and agrees to view in [place]. Active MyChart status and patient understanding of how to access instructions and care plan via MyChart confirmed with patient.     Telephone follow up appointment with care management team member scheduled for: 06-18-2022 at 33 am  [place], MSN, [place]  Mobile: [phone]    Prostate Cancer  The prostate is a small gland that helps make semen. It is located below a man's bladder, in front of the rectum. Prostate cancer is when abnormal cells grow in this gland. What are the causes? The cause of this condition is not known. What increases the risk? Being age 51 or older. Having a family history of prostate cancer. Having a family history of cancer of the breasts or ovaries. Having genes that are passed from parent to child (inherited). Having Lynch syndrome. African American men and men of African descent are diagnosed with prostate cancer at higher rates than other men. What are the signs or symptoms? Problems peeing (urinating). This may include: A stream that is weak, or pee that stops and starts. Trouble starting or stopping your pee. Trouble emptying all of your pee. Needing to pee more often, especially at night. Blood in your pee or semen. Pain in the: Lower back. Lower belly (abdomen). Hips. Trouble getting an erection. Weakness or numbness in the legs or feet. How is this treated? Treatment for this condition depends on: How much the cancer has spread. Your age. The  kind of treatment you want. Your health. Treatments include: Being watched. This is called observation. You will be tested from time to time, but you will not get treated. Tests are to make sure that the cancer is not growing. Surgery. This may be done to: Take out (remove) the prostate. Freeze and kill cancer cells. Radiation. This uses a strong beam of  energy to kill cancer cells. Chemotherapy. This uses medicines that stop cancer cells from increasing. This kills cancer cells and healthy cells. Targeted therapy. This kills cancer cells only. Healthy cells are not affected. Hormone treatment. This stops the body from making hormones that help the cancer cells grow. Follow these instructions at home: Lifestyle Do not smoke or use any products that contain nicotine or tobacco. If you need help quitting, ask your doctor. Eat a healthy diet. Treatment may affect your ability to have sex. If you have a partner, touch, hold, hug, and caress your partner to have intimate moments. Get plenty of sleep. Ask your doctor for help to find a support group for men with prostate cancer. General instructions Take over-the-counter and prescription medicines only as told by your doctor. If you have to go to the hospital, let your cancer doctor (oncologist) know. Keep all follow-up visits. Where to find more information American Cancer Society: www.cancer.[place] [employer] Clinical Oncology: www.cancer.net [employer]: www.cancer.gov Contact a doctor if: You have new or more trouble peeing. You have new or more blood in your pee. You have new or more pain in your hips, back, or chest. Get help right away if: You have weakness in your legs. You lose feeling in your legs. You cannot control your pee or your poop (stool). You have chills or a fever. Summary The prostate is a male gland that helps make semen. Prostate cancer is when abnormal cells grow in this gland. Treatment includes doing surgery, using medicines, using strong beams of energy, or watching without treatment. Ask your doctor for help to find a support group for men with prostate cancer. Contact a doctor if you have problems peeing or have any new pain that you did not have before. This information is not intended to replace advice given to you by your health care  provider. Make sure you discuss any questions you have with your health care provider. Document Revised: 09/17/2020 Document Reviewed: 09/17/2020 Elsevier Patient Education  [place] for Incontinence After Prostate Surgery You will learn about some forms of treatment to help regain bladder control including, Kegel exercises, medications, injections and surgery. To view the content, go to this web address: https://pe.elsevier.com/ifp5wpa  This video will expire on: 12/26/2023. If you need access to this video following this date, please reach out to the healthcare provider who assigned it to you. This information is not intended to replace advice given to you by your health care provider. Make sure you discuss any questions you have with your health care provider. Elsevier Patient Education  [place].

## 2022-03-29 ENCOUNTER — Ambulatory Visit
Admission: RE | Admit: 2022-03-29 | Discharge: 2022-03-29 | Disposition: A | Discharge status: Home / Self Care | Payer: Medicare HMO | Source: Ambulatory Visit | Attending: Radiation Oncology | Admitting: Radiation Oncology

## 2022-03-29 ENCOUNTER — Other Ambulatory Visit: Payer: Self-pay

## 2022-03-29 DIAGNOSIS — Z510 Encounter for antineoplastic radiation therapy: Secondary | ICD-10-CM | POA: Diagnosis not present

## 2022-03-29 DIAGNOSIS — Z191 Hormone sensitive malignancy status: Secondary | ICD-10-CM | POA: Diagnosis not present

## 2022-03-29 DIAGNOSIS — C61 Malignant neoplasm of prostate: Secondary | ICD-10-CM | POA: Diagnosis not present

## 2022-03-29 LAB — RAD ONC ARIA SESSION SUMMARY
Course Elapsed Days: 46
Plan Fractions Treated to Date: 32
Plan Prescribed Dose Per Fraction: 2 Gy
Plan Total Fractions Prescribed: 40
Plan Total Prescribed Dose: 80 Gy
Reference Point Dosage Given to Date: 64 Gy
Reference Point Session Dosage Given: 2 Gy
Session Number: 32

## 2022-03-30 ENCOUNTER — Other Ambulatory Visit: Payer: Self-pay

## 2022-03-30 ENCOUNTER — Ambulatory Visit
Admission: RE | Admit: 2022-03-30 | Discharge: 2022-03-30 | Disposition: A | Discharge status: Home / Self Care | Payer: Medicare HMO | Source: Ambulatory Visit | Attending: Radiation Oncology | Admitting: Radiation Oncology

## 2022-03-30 DIAGNOSIS — Z510 Encounter for antineoplastic radiation therapy: Secondary | ICD-10-CM | POA: Diagnosis not present

## 2022-03-30 DIAGNOSIS — C61 Malignant neoplasm of prostate: Secondary | ICD-10-CM | POA: Diagnosis not present

## 2022-03-30 DIAGNOSIS — Z191 Hormone sensitive malignancy status: Secondary | ICD-10-CM | POA: Diagnosis not present

## 2022-03-30 LAB — RAD ONC ARIA SESSION SUMMARY
Course Elapsed Days: 47
Plan Fractions Treated to Date: 33
Plan Prescribed Dose Per Fraction: 2 Gy
Plan Total Fractions Prescribed: 40
Plan Total Prescribed Dose: 80 Gy
Reference Point Dosage Given to Date: 66 Gy
Reference Point Session Dosage Given: 2 Gy
Session Number: 33

## 2022-03-31 ENCOUNTER — Ambulatory Visit
Admission: RE | Admit: 2022-03-31 | Discharge: 2022-03-31 | Disposition: A | Discharge status: Home / Self Care | Payer: Medicare HMO | Source: Ambulatory Visit | Attending: Radiation Oncology | Admitting: Radiation Oncology

## 2022-03-31 ENCOUNTER — Other Ambulatory Visit: Payer: Self-pay

## 2022-03-31 DIAGNOSIS — C61 Malignant neoplasm of prostate: Secondary | ICD-10-CM | POA: Diagnosis not present

## 2022-03-31 DIAGNOSIS — Z510 Encounter for antineoplastic radiation therapy: Secondary | ICD-10-CM | POA: Diagnosis not present

## 2022-03-31 DIAGNOSIS — Z191 Hormone sensitive malignancy status: Secondary | ICD-10-CM | POA: Diagnosis not present

## 2022-03-31 LAB — RAD ONC ARIA SESSION SUMMARY
Course Elapsed Days: 48
Plan Fractions Treated to Date: 34
Plan Prescribed Dose Per Fraction: 2 Gy
Plan Total Fractions Prescribed: 40
Plan Total Prescribed Dose: 80 Gy
Reference Point Dosage Given to Date: 68 Gy
Reference Point Session Dosage Given: 2 Gy
Session Number: 34

## 2022-04-01 ENCOUNTER — Other Ambulatory Visit: Payer: Self-pay

## 2022-04-01 ENCOUNTER — Ambulatory Visit
Admission: RE | Admit: 2022-04-01 | Discharge: 2022-04-01 | Disposition: A | Discharge status: Home / Self Care | Payer: Medicare HMO | Source: Ambulatory Visit | Attending: Radiation Oncology | Admitting: Radiation Oncology

## 2022-04-01 DIAGNOSIS — C61 Malignant neoplasm of prostate: Secondary | ICD-10-CM | POA: Diagnosis not present

## 2022-04-01 DIAGNOSIS — Z510 Encounter for antineoplastic radiation therapy: Secondary | ICD-10-CM | POA: Diagnosis not present

## 2022-04-01 DIAGNOSIS — Z191 Hormone sensitive malignancy status: Secondary | ICD-10-CM | POA: Diagnosis not present

## 2022-04-01 LAB — RAD ONC ARIA SESSION SUMMARY
Course Elapsed Days: 49
Plan Fractions Treated to Date: 35
Plan Prescribed Dose Per Fraction: 2 Gy
Plan Total Fractions Prescribed: 40
Plan Total Prescribed Dose: 80 Gy
Reference Point Dosage Given to Date: 70 Gy
Reference Point Session Dosage Given: 2 Gy
Session Number: 35

## 2022-04-02 ENCOUNTER — Other Ambulatory Visit: Payer: Self-pay

## 2022-04-02 ENCOUNTER — Ambulatory Visit
Admission: RE | Admit: 2022-04-02 | Discharge: 2022-04-02 | Disposition: A | Discharge status: Home / Self Care | Payer: Medicare HMO | Source: Ambulatory Visit | Attending: Radiation Oncology | Admitting: Radiation Oncology

## 2022-04-02 DIAGNOSIS — Z510 Encounter for antineoplastic radiation therapy: Secondary | ICD-10-CM | POA: Diagnosis not present

## 2022-04-02 DIAGNOSIS — Z191 Hormone sensitive malignancy status: Secondary | ICD-10-CM | POA: Diagnosis not present

## 2022-04-02 DIAGNOSIS — C61 Malignant neoplasm of prostate: Secondary | ICD-10-CM | POA: Diagnosis not present

## 2022-04-02 LAB — RAD ONC ARIA SESSION SUMMARY
Course Elapsed Days: 50
Plan Fractions Treated to Date: 36
Plan Prescribed Dose Per Fraction: 2 Gy
Plan Total Fractions Prescribed: 40
Plan Total Prescribed Dose: 80 Gy
Reference Point Dosage Given to Date: 72 Gy
Reference Point Session Dosage Given: 2 Gy
Session Number: 36

## 2022-04-05 ENCOUNTER — Ambulatory Visit
Admission: RE | Admit: 2022-04-05 | Discharge: 2022-04-05 | Disposition: A | Discharge status: Home / Self Care | Payer: Medicare HMO | Source: Ambulatory Visit | Attending: Radiation Oncology | Admitting: Radiation Oncology

## 2022-04-05 ENCOUNTER — Other Ambulatory Visit: Payer: Self-pay

## 2022-04-05 DIAGNOSIS — C61 Malignant neoplasm of prostate: Secondary | ICD-10-CM | POA: Diagnosis not present

## 2022-04-05 DIAGNOSIS — Z510 Encounter for antineoplastic radiation therapy: Secondary | ICD-10-CM | POA: Diagnosis not present

## 2022-04-05 DIAGNOSIS — Z191 Hormone sensitive malignancy status: Secondary | ICD-10-CM | POA: Diagnosis not present

## 2022-04-05 LAB — RAD ONC ARIA SESSION SUMMARY
Course Elapsed Days: 53
Plan Fractions Treated to Date: 37
Plan Prescribed Dose Per Fraction: 2 Gy
Plan Total Fractions Prescribed: 40
Plan Total Prescribed Dose: 80 Gy
Reference Point Dosage Given to Date: 74 Gy
Reference Point Session Dosage Given: 2 Gy
Session Number: 37

## 2022-04-06 ENCOUNTER — Other Ambulatory Visit: Payer: Self-pay

## 2022-04-06 ENCOUNTER — Ambulatory Visit
Admission: RE | Admit: 2022-04-06 | Discharge: 2022-04-06 | Disposition: A | Discharge status: Home / Self Care | Payer: Medicare HMO | Source: Ambulatory Visit | Attending: Radiation Oncology | Admitting: Radiation Oncology

## 2022-04-06 DIAGNOSIS — Z510 Encounter for antineoplastic radiation therapy: Secondary | ICD-10-CM | POA: Diagnosis not present

## 2022-04-06 DIAGNOSIS — C61 Malignant neoplasm of prostate: Secondary | ICD-10-CM | POA: Diagnosis not present

## 2022-04-06 DIAGNOSIS — Z191 Hormone sensitive malignancy status: Secondary | ICD-10-CM | POA: Diagnosis not present

## 2022-04-06 LAB — RAD ONC ARIA SESSION SUMMARY
Course Elapsed Days: 54
Plan Fractions Treated to Date: 38
Plan Prescribed Dose Per Fraction: 2 Gy
Plan Total Fractions Prescribed: 40
Plan Total Prescribed Dose: 80 Gy
Reference Point Dosage Given to Date: 76 Gy
Reference Point Session Dosage Given: 2 Gy
Session Number: 38

## 2022-04-07 ENCOUNTER — Ambulatory Visit
Admission: RE | Admit: 2022-04-07 | Discharge: 2022-04-07 | Disposition: A | Discharge status: Home / Self Care | Payer: Medicare HMO | Source: Ambulatory Visit | Attending: Radiation Oncology | Admitting: Radiation Oncology

## 2022-04-07 ENCOUNTER — Other Ambulatory Visit: Payer: Self-pay

## 2022-04-07 ENCOUNTER — Inpatient Hospital Stay: Payer: Medicare HMO | Attending: Radiation Oncology

## 2022-04-07 DIAGNOSIS — C61 Malignant neoplasm of prostate: Secondary | ICD-10-CM | POA: Diagnosis not present

## 2022-04-07 DIAGNOSIS — Z191 Hormone sensitive malignancy status: Secondary | ICD-10-CM | POA: Diagnosis not present

## 2022-04-07 DIAGNOSIS — Z510 Encounter for antineoplastic radiation therapy: Secondary | ICD-10-CM | POA: Diagnosis not present

## 2022-04-07 LAB — RAD ONC ARIA SESSION SUMMARY
Course Elapsed Days: 55
Plan Fractions Treated to Date: 39
Plan Prescribed Dose Per Fraction: 2 Gy
Plan Total Fractions Prescribed: 40
Plan Total Prescribed Dose: 80 Gy
Reference Point Dosage Given to Date: 78 Gy
Reference Point Session Dosage Given: 2 Gy
Session Number: 39

## 2022-04-08 ENCOUNTER — Ambulatory Visit
Admission: RE | Admit: 2022-04-08 | Discharge: 2022-04-08 | Disposition: A | Discharge status: Home / Self Care | Payer: Medicare HMO | Source: Ambulatory Visit | Attending: Radiation Oncology | Admitting: Radiation Oncology

## 2022-04-08 ENCOUNTER — Other Ambulatory Visit: Payer: Self-pay

## 2022-04-08 DIAGNOSIS — Z191 Hormone sensitive malignancy status: Secondary | ICD-10-CM | POA: Diagnosis not present

## 2022-04-08 DIAGNOSIS — Z510 Encounter for antineoplastic radiation therapy: Secondary | ICD-10-CM | POA: Diagnosis not present

## 2022-04-08 DIAGNOSIS — C61 Malignant neoplasm of prostate: Secondary | ICD-10-CM | POA: Diagnosis not present

## 2022-04-08 LAB — RAD ONC ARIA SESSION SUMMARY
Course Elapsed Days: 56
Plan Fractions Treated to Date: 40
Plan Prescribed Dose Per Fraction: 2 Gy
Plan Total Fractions Prescribed: 40
Plan Total Prescribed Dose: 80 Gy
Reference Point Dosage Given to Date: 80 Gy
Reference Point Session Dosage Given: 2 Gy
Session Number: 40

## 2022-04-16 DIAGNOSIS — D708 Other neutropenia: Secondary | ICD-10-CM | POA: Diagnosis not present

## 2022-04-16 DIAGNOSIS — I10 Essential (primary) hypertension: Secondary | ICD-10-CM | POA: Diagnosis not present

## 2022-04-16 DIAGNOSIS — R7303 Prediabetes: Secondary | ICD-10-CM | POA: Diagnosis not present

## 2022-04-16 DIAGNOSIS — E7800 Pure hypercholesterolemia, unspecified: Secondary | ICD-10-CM | POA: Diagnosis not present

## 2022-04-23 DIAGNOSIS — E7800 Pure hypercholesterolemia, unspecified: Secondary | ICD-10-CM | POA: Diagnosis not present

## 2022-04-23 DIAGNOSIS — R7303 Prediabetes: Secondary | ICD-10-CM | POA: Diagnosis not present

## 2022-04-23 DIAGNOSIS — I10 Essential (primary) hypertension: Secondary | ICD-10-CM | POA: Diagnosis not present

## 2022-04-23 DIAGNOSIS — D649 Anemia, unspecified: Secondary | ICD-10-CM | POA: Diagnosis not present

## 2022-04-23 DIAGNOSIS — Z6836 Body mass index (BMI) 36.0-36.9, adult: Secondary | ICD-10-CM | POA: Diagnosis not present

## 2022-05-06 ENCOUNTER — Encounter: Payer: Self-pay | Admitting: Radiation Oncology

## 2022-05-06 ENCOUNTER — Ambulatory Visit
Admission: RE | Admit: 2022-05-06 | Discharge: 2022-05-06 | Disposition: A | Discharge status: Home / Self Care | Payer: Medicare HMO | Source: Ambulatory Visit | Attending: Radiation Oncology | Admitting: Radiation Oncology

## 2022-05-06 ENCOUNTER — Other Ambulatory Visit: Payer: Self-pay | Admitting: *Deleted

## 2022-05-06 VITALS — BP 145/96 | HR 80 | Temp 98.0°F | Resp 16 | Ht 66.5 in | Wt 225.0 lb

## 2022-05-06 DIAGNOSIS — C61 Malignant neoplasm of prostate: Secondary | ICD-10-CM

## 2022-05-06 NOTE — Progress Notes (Signed)
Radiation Oncology Follow up Note  Name: William Duffy   Date:   05/06/2022 MRN:  [id number] DOB: 1955-08-21    This 66 y.o. male presents to the clinic today for 1 month follow-up status post salvage radiation therapy and patient with known stage IIIc (cT2 N0 M0) Gleason 9 (5+4) adenocarcinoma prostate presenting with a PSA of 48 status post robotic assisted prostatectomy with biochemical failure.  REFERRING PROVIDER: Dion Body, MD  HPI: Patient is a 66 year old male now at 1 month having completed salvage radiation therapy to his prostatic fossa and pelvic nodes for Gleason 9 adenocarcinoma status post robotic assisted prostatectomy.  He is seen today in routine follow-up is doing fairly well does have nocturia x4-5 and some urgency and frequency.  No bowel problems at this time he is having no bone pain..  COMPLICATIONS OF TREATMENT: none  FOLLOW UP COMPLIANCE: keeps appointments   PHYSICAL EXAM:  BP (!) 145/96 (BP Location: Right Arm, Patient Position: Sitting, Cuff Size: Normal)   Pulse 80   Temp 98 F (36.7 C) (Tympanic)   Resp 16   Ht 5' 6.5" (1.689 m)   Wt 225 lb (102.1 kg)   BMI 35.77 kg/m  Well-developed well-nourished patient in NAD. HEENT reveals PERLA, EOMI, discs not visualized.  Oral cavity is clear. No oral mucosal lesions are identified. Neck is clear without evidence of cervical or supraclavicular adenopathy. Lungs are clear to A&P. Cardiac examination is essentially unremarkable with regular rate and rhythm without murmur rub or thrill. Abdomen is benign with no organomegaly or masses noted. Motor sensory and DTR levels are equal and symmetric in the upper and lower extremities. Cranial nerves II through XII are grossly intact. Proprioception is intact. No peripheral adenopathy or edema is identified. No motor or sensory levels are noted. Crude visual fields are within normal range.  RADIOLOGY RESULTS: No current films for review  PLAN: At the present time  I recommended Azo for his urinary symptoms.  He is to take it as directed over-the-counter.  This certainly may help with some of his frequency and nocturia.  I have asked him back in 3 months for a follow-up check with a PSA.  I will continue ADT suppression for at least 2 to 3 years and that is being provided by urology.  Patient is to call with any concerns.  I would like to take this opportunity to thank you for allowing me to participate in the care of your patient.Noreene Filbert, MD

## 2022-06-18 ENCOUNTER — Encounter: Payer: Self-pay | Admitting: *Deleted

## 2022-06-18 ENCOUNTER — Ambulatory Visit: Payer: Self-pay | Admitting: *Deleted

## 2022-06-18 NOTE — Patient Outreach (Signed)
  Care Coordination   Follow Up Visit Note   06/18/2022 Name: William Duffy MRN: [id number] DOB: 1956/05/06  William Duffy is a 66 y.o. year old male who sees William Body, MD for primary care. I spoke with  William Duffy by phone today.  What matters to the patients health and wellness today? Doing well post prostate surgery and radiation treatment.    Goals Addressed             This Visit's Progress    RNCM: Effective Management of Prostate Cancer   On track    Care Coordination Interventions: Assessed patient understanding of cancer diagnosis and recommended treatment plan. Radiation treatment has completed.  He is doing well since surgery except for issues with leakage. State he was told this would slow down about 4-5 months post surgery but this has not happened.  He has a clamp but this is very uncomfortable, prefers not to use.  He has been using depends, this has helped.  He will discuss ongoing concern with provider next month.   Reviewed upcoming provider appointments and treatment appointments. Urology on 1/16, cancer center on 2/5, and PCP in March. No concerns with making appointments.  Assessed available transportation to appointments and treatments. Has consistent/reliable transportation: Yes Assessed support system. Has consistent/reliable family or other support: Yes The patient agrees to outreaches from the [facility]. The patient has the [facility] number to call for new questions, concerns, or needs. Review of the care coordination program and resources available.  He has continued to his exercise regime and is now back to refereeing basketball and football           SDOH assessments and interventions completed:  No     Care Coordination Interventions:  Yes, provided   Follow up plan: Follow up call scheduled for 2/14    Encounter Outcome:  Pt. Visit Completed   William David, RN, MSN, [place] Care Management Care Management Coordinator [phone]

## 2022-06-18 NOTE — Telephone Encounter (Signed)
This encounter was created in error - please disregard.

## 2022-06-18 NOTE — Patient Instructions (Signed)
Visit Information  Thank you for taking time to visit with me today. Please don't hesitate to contact me if I can be of assistance to you before our next scheduled telephone appointment.  Following are the goals we discussed today:  Discuss ongoing leaking with provider in January.  Our next appointment is by telephone on 2/14  Please call the care guide team at [phone] if you need to cancel or reschedule your appointment.   Please call the Suicide and Crisis Lifeline: 988 call the [place] National Suicide Prevention Lifeline: 8658356883 or TTY: (856) 808-6902 TTY (779)643-3466) to talk to a trained counselor call 1-800-273-TALK (toll free, 24 hour hotline) call 911 if you are experiencing a Mental Health or [place] or need someone to talk to.  Patient verbalizes understanding of instructions and care plan provided today and agrees to view in [place]. Active MyChart status and patient understanding of how to access instructions and care plan via MyChart confirmed with patient.     The patient has been provided with contact information for the care management team and has been advised to call with any health related questions or concerns.   [name], RN, MSN, [place] Care Management Care Management Coordinator [phone]

## 2022-07-20 ENCOUNTER — Ambulatory Visit: Payer: Medicare HMO | Admitting: Urology

## 2022-07-20 ENCOUNTER — Telehealth: Payer: Self-pay | Admitting: *Deleted

## 2022-07-20 ENCOUNTER — Other Ambulatory Visit
Admission: RE | Admit: 2022-07-20 | Discharge: 2022-07-20 | Disposition: A | Discharge status: Home / Self Care | Payer: Medicare HMO | Attending: Urology | Admitting: Urology

## 2022-07-20 ENCOUNTER — Encounter: Payer: Self-pay | Admitting: Urology

## 2022-07-20 VITALS — BP 175/103 | HR 83 | Ht 66.5 in | Wt 225.0 lb

## 2022-07-20 DIAGNOSIS — C61 Malignant neoplasm of prostate: Secondary | ICD-10-CM | POA: Insufficient documentation

## 2022-07-20 DIAGNOSIS — N393 Stress incontinence (female) (male): Secondary | ICD-10-CM

## 2022-07-20 LAB — PSA: Prostatic Specific Antigen: 0.04 ng/mL (ref 0.00–4.00)

## 2022-07-20 MED ORDER — LEUPROLIDE ACETATE (6 MONTH) 45 MG ~~LOC~~ KIT
45.0000 mg | PACK | Freq: Once | SUBCUTANEOUS | Status: AC
  Administered 2022-07-20: 45 mg via SUBCUTANEOUS

## 2022-07-20 NOTE — Telephone Encounter (Signed)
Canceled 1/29 appt and called patient.

## 2022-07-20 NOTE — Progress Notes (Signed)
Eligard SubQ Injection   Due to Prostate Cancer patient is present today for a Eligard Injection.  Medication: Eligard 6 month Dose: 45 mg  Location: left arm Lot: [id number] Exp: 07/2023  Patient tolerated well, no complications were noted.  Performed by: Gordy Clement, [place]   Per Dr. Diamantina Providence patient is to continue therapy for 2 years total, patient began ADT July 2023. Patient's next follow up was scheduled for January 18, 2023. This appointment was scheduled using wheel and given to patient today along with reminder continue on Vitamin D 800-1000iu and Calcium 1000-'1200mg'$  daily while on Androgen Deprivation Therapy.  PA approval dates: No PA required.

## 2022-07-20 NOTE — Progress Notes (Signed)
   07/20/2022 9:17 AM   William Duffy 12/04/1955 [id number]  Reason for visit: Follow up high risk prostate cancer, stress incontinence  HPI: Healthy 67 year old male with a PSA of 27 who was diagnosed with high risk prostate cancer (5+4=9) on prostate biopsy with Dr. Bernardo Heater, and PSMA PET scan showed no definite evidence of metastatic disease, but suspected seminal vesicle involvement.  Using shared decision making with his good health, he opted for radical prostatectomy with the understanding of high likelihood of extraprostatic extension with positive margins and need for additional treatments including likely radiation and ADT.   He underwent robotic radical prostatectomy and bilateral pelvic lymph node dissection on 09/14/2021.  Pathology showed 5+4=9 grade group 5 disease with extension through the seminal vesicles posteriorly.  Pelvic lymph nodes were negative for metastatic disease bilaterally.  At that time I recommended close follow-up in 1 month for PSA, with plan for referral for ADT and referral to radiation oncology if PSA remained detectable, but unfortunately he did not follow-up.  PSA 01/08/2022 was significantly elevated at 24.5 indicating residual disease.  Urinalysis and urine culture were negative.  PSMA PET scan on 01/26/2022 showed significant radiotracer activity superior to the prostatectomy bed consistent with his extraprostatic extension identified at time of surgery, as well as a single radiotracer avid right external iliac lymph node that was increased in size consistent with nodal metastasis.  He received Eligard 27-monthinjection 01/19/2022, with plan to continue for at least 2 years.  Eligard 630-monthnjection given again today.  He completed radiation therapy to his prostatic fossa and pelvic nodes with Dr. ChBaruch Goutynd October 2023.  Overall he feels well.  He initially had some hot flashes with the Eligard, but those have since resolved since he started vitamin E  supplementation.  He denies any decrease in energy or other side effects from the ADT.  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  He reports some urinary incontinence that has improved over the last 6 months and still requires 2 pads per day.  He does have some urgency and some urge incontinence as well, he is not interested in trying medications at this time.  He denies any pelvic pain or dysuria.  He also drinks a fair amount of energy drinks, which likely contributed to his urinary symptoms.  We reviewed Kegel exercises again, he also had a few visits with pelvic floor physical therapy which he felt were helpful, and is considering getting back in with them now that he has completed radiation.  He had significant ED refractory to PDE 5 inhibitors prior to prostate cancer surgery and treatment, and is not interested in further ED treatments at this time.  We again reviewed his pathology with very high risk disease, potential need for referral to oncology in the future pending PSA values to consider additional treatments.  PSA will be drawn today and call with those results.  -RTC 6 months to continue ADT(01/2022 -at least 01/2024) -PSA today, call with results, if elevated will also refer to oncology to consider additional therapies  BrBilley CoMDWalnut28 Oak Valley CourtSuMilanuDesert PalmsNC [phone]

## 2022-07-20 NOTE — Telephone Encounter (Signed)
Patient called reporting that Dr Glori Luis ordered and drew a PSA today and so he is asking if the 1/29 lab appointment Dr Baruch Gouty ordered for PSA can be cancelled. Please return his call

## 2022-07-20 NOTE — Patient Instructions (Addendum)
Hormone Suppression Therapy for Prostate Cancer  Hormone suppression therapy is a treatment for prostate cancer that can help slow the growth of cancer cells in the prostate gland. It is also called androgen deprivation therapy (ADT) or androgen suppression therapy. Hormone suppression therapy targets male sex hormones (androgens) in the body that help cancer cells grow. Hormone suppression therapy alone will not cure prostate cancer, but it can slow the growth of cancer cells and may shrink tumors over time. Your health care provider can help you find the best treatment that fits your lifestyle. Hormone suppression therapy may be used in the following cases: When prostate cancer has spread too far to other places in the body and cannot be cured by surgery or radiation. When a person has health problems that prevent the use of surgery or radiation. Before radiation to help shrink the size of the cancer and make the radiation treatment more effective. If the prostate cancer remains or comes back following treatment with surgery or radiation. What are the types of hormone suppression therapy? Orchiectomy Orchiectomy, also called surgical castration, is a surgery to remove one or both testicles. The testicles make the two main androgens--testosterone and dihydrotestosterone (DHT). This surgery reduces the levels of testosterone in the blood, leading to decreased androgen production. Medicine therapy Medicine therapy, also called medical or chemical castration, involves taking medicines to keep your body from making or using androgens. Medicines can do this in one of three ways: Reducing androgen production by the testicles. Luteinizing hormone-releasing hormone [facility]) agonists. These medicines are injected or implanted under your skin to lower the amount of androgens that your testicles make. Depending on the medicine, they can be given monthly or up to every 3 to 6 months. If you take these medicines,  you may also be prescribed other medicines to help with side effects. LHRH antagonists. These medicines also work to lower the amount of androgens made in the testicles, but they work faster than [facility] agonist medicines and have less severe side effects. They are given as a monthly injection under the skin, and they are used when prostate cancer is in an advanced stage. Estrogens. These medicines are male hormones that help to reduce androgen production by the testicles. Estrogens are not used as commonly as other types of hormone suppression therapy due to their side effects. However, they may be used if other treatments do not work. Blocking androgen attachment throughout the body. Anti-androgen medicines, also called androgen receptor antagonists, block areas on the body where androgens attach. These are pills that are usually used in combination with other types of hormone suppression therapy, like orchiectomy and other medicines. Blocking androgen production throughout the body. Androgen synthesis inhibitor medicines. These medicines help to stop other areas of the body from making androgens. They are taken as pills. They may be used if the prostate cancer is advanced and has not gotten better with surgery or other medicines. A steroid medicine may be given with this type of medicine to help with side effects. What are the risks? Hormone suppression therapy may cause side effects, including: Hot flashes. Diarrhea and nausea. Itching. Sexual side effects, such as: Decrease or lack of sexual desire. Decrease in size of the penis or testicles. Inability to get an erection (erectile dysfunction, or impotence). Breast tenderness or increase in breast size. Fatigue. Weight gain. Anemia. Thinning of the bones (osteoporosis) and loss of muscle mass. Depression, mood swings, and trouble with thinking or focusing. Hormone suppression therapy may also increase  your risk of high blood pressure,  increased cholesterol levels, stroke, heart attack, or diabetes. What are the benefits? One of the main benefits of hormone suppression therapy is having additional treatment options. You may have only one type of treatment, or two or more types at the same time. Treatments may be combined to: Help with side effects. Treat advanced cancer. Where to find more information American Cancer Society: www.cancer.[place]: www.cancer.gov Contact a health care provider if: You have pain or side effects that do not get better with treatment. You have trouble urinating. You have new side effects that do not go away. Get help right away if: You have severe chest pain. You have trouble breathing. You have an irregular heartbeat. You have numbness or paralysis in the lower half of your body. You are confused. You have trouble talking or understanding speech. These symptoms may be an emergency. Do not wait to see if the symptoms will go away. Get medical help right away. Call your local emergency services (911 in the [place] Do not drive yourself to the hospital. Summary Hormone suppression therapy is a treatment for prostate cancer that can help to slow the growth of cancer cells in the prostate gland. Hormone suppression therapy alone will not cure prostate cancer, but it can slow the growth of prostate cancer and may shrink tumors over time. Treatment to suppress hormones may include surgery or medicines. Side effects such as hot flashes, changes in sexual function or desire, and weakened bones can result from hormone suppression therapy. This information is not intended to replace advice given to you by your health care provider. Make sure you discuss any questions you have with your health care provider. Document Revised: 10/02/2020 Document Reviewed: 10/02/2020 Elsevier Patient Education  [place].  Kegel Exercises  Kegel exercises can help strengthen your pelvic  floor muscles. The pelvic floor is a group of muscles that support your rectum, small intestine, and bladder. In females, pelvic floor muscles also help support the uterus. These muscles help you control the flow of urine and stool (feces). Kegel exercises are painless and simple. They do not require any equipment. Your provider may suggest Kegel exercises to: Improve bladder and bowel control. Improve sexual response. Improve weak pelvic floor muscles after surgery to remove the uterus (hysterectomy) or after pregnancy, in females. Improve weak pelvic floor muscles after prostate gland removal or surgery, in males. Kegel exercises involve squeezing your pelvic floor muscles. These are the same muscles you squeeze when you try to stop the flow of urine or keep from passing gas. The exercises can be done while sitting, standing, or lying down, but it is best to vary your position. Ask your health care provider which exercises are safe for you. Do exercises exactly as told by your health care provider and adjust them as directed. Do not begin these exercises until told by your health care provider. Exercises How to do Kegel exercises: Squeeze your pelvic floor muscles tight. You should feel a tight lift in your rectal area. If you are a male, you should also feel a tightness in your vaginal area. Keep your stomach, buttocks, and legs relaxed. Hold the muscles tight for up to 10 seconds. Breathe normally. Relax your muscles for up to 10 seconds. Repeat as told by your health care provider. Repeat this exercise daily as told by your health care provider. Continue to do this exercise for at least 4-6 weeks, or for as long as told  by your health care provider. You may be referred to a physical therapist who can help you learn more about how to do Kegel exercises. Depending on your condition, your health care provider may recommend: Varying how long you squeeze your muscles. Doing several sets of  exercises every day. Doing exercises for several weeks. Making Kegel exercises a part of your regular exercise routine. This information is not intended to replace advice given to you by your health care provider. Make sure you discuss any questions you have with your health care provider. Document Revised: 10/30/2020 Document Reviewed: 10/30/2020 Elsevier Patient Education  [place].

## 2022-07-30 DIAGNOSIS — Z6837 Body mass index (BMI) 37.0-37.9, adult: Secondary | ICD-10-CM | POA: Diagnosis not present

## 2022-07-30 DIAGNOSIS — I10 Essential (primary) hypertension: Secondary | ICD-10-CM | POA: Diagnosis not present

## 2022-07-30 DIAGNOSIS — M79645 Pain in left finger(s): Secondary | ICD-10-CM | POA: Diagnosis not present

## 2022-08-02 ENCOUNTER — Other Ambulatory Visit: Payer: Medicare HMO

## 2022-08-09 ENCOUNTER — Encounter: Payer: Self-pay | Admitting: Radiation Oncology

## 2022-08-09 ENCOUNTER — Ambulatory Visit
Admission: RE | Admit: 2022-08-09 | Discharge: 2022-08-09 | Disposition: A | Discharge status: Home / Self Care | Payer: Medicare HMO | Source: Ambulatory Visit | Attending: Radiation Oncology | Admitting: Radiation Oncology

## 2022-08-09 VITALS — BP 142/98 | HR 75 | Temp 97.4°F | Resp 20 | Ht 66.5 in | Wt 228.0 lb

## 2022-08-09 DIAGNOSIS — R351 Nocturia: Secondary | ICD-10-CM | POA: Insufficient documentation

## 2022-08-09 DIAGNOSIS — Z923 Personal history of irradiation: Secondary | ICD-10-CM | POA: Diagnosis not present

## 2022-08-09 DIAGNOSIS — C61 Malignant neoplasm of prostate: Secondary | ICD-10-CM | POA: Diagnosis not present

## 2022-08-09 DIAGNOSIS — Z191 Hormone sensitive malignancy status: Secondary | ICD-10-CM | POA: Diagnosis not present

## 2022-08-09 NOTE — Progress Notes (Signed)
Radiation Oncology Follow up Note  Name: William Duffy   Date:   08/09/2022 MRN:  [id number] DOB: 1955/07/24    This 67 y.o. male presents to the clinic today for 73-monthfollow-up status post salvage radiation therapy in a patient with known stage IIIc (cT2 N0 M0) Gleason 9 (5+4) adenocarcinoma prostate presenting with a PSA of 48 status post robotic assisted prostatectomy with biochemical failure.  REFERRING PROVIDER: LDion Body MD  HPI: Patient is a 67year old male now out for months having completed salvage radiation therapy to his prostatic fossa and patient with known stage IIIc Gleason 9 adenocarcinoma presenting with a PSA of 48 with biochemical failure after robotic assisted prostatectomy.  Seen today in routine follow-up doing fairly well still has nocturia x 4-5.  He recently received another ADT injection.  His most recent PSA in January was 04.49 COMPLICATIONS OF TREATMENT: none  FOLLOW UP COMPLIANCE: keeps appointments   PHYSICAL EXAM:  BP (!) 142/98 (BP Location: Right Arm, Patient Position: Sitting, Cuff Size: Normal) Comment: patient recently increased BP med he monitors at home  Pulse 75   Temp (!) 97.4 F (36.3 C) (Tympanic)   Resp 20   Ht 5' 6.5" (1.689 m)   Wt 228 lb (103.4 kg)   BMI 36.25 kg/m  Well-developed well-nourished patient in NAD. HEENT reveals PERLA, EOMI, discs not visualized.  Oral cavity is clear. No oral mucosal lesions are identified. Neck is clear without evidence of cervical or supraclavicular adenopathy. Lungs are clear to A&P. Cardiac examination is essentially unremarkable with regular rate and rhythm without murmur rub or thrill. Abdomen is benign with no organomegaly or masses noted. Motor sensory and DTR levels are equal and symmetric in the upper and lower extremities. Cranial nerves II through XII are grossly intact. Proprioception is intact. No peripheral adenopathy or edema is identified. No motor or sensory levels are noted. Crude  visual fields are within normal range.  RADIOLOGY RESULTS: No current films for review  PLAN: Present time patient is doing well.  Continues on ADT therapy.  PSA is 0.04 I have asked to see him back in 6 months with a follow-up PSA.  Patient is to call with any concerns.  I would like to take this opportunity to thank you for allowing me to participate in the care of your patient..Noreene Filbert MD

## 2022-08-18 ENCOUNTER — Ambulatory Visit: Payer: Self-pay | Admitting: *Deleted

## 2022-08-18 NOTE — Patient Outreach (Signed)
  Care Coordination   08/18/2022 Name: William Duffy MRN: [id number] DOB: 02/28/1956   Care Coordination Outreach Attempts:  An unsuccessful telephone outreach was attempted for a scheduled appointment today.  Follow Up Plan:  Additional outreach attempts will be made to offer the patient care coordination information and services.   Encounter Outcome:  No Answer   Care Coordination Interventions:  No, not indicated    [name], RN, MSN, [facility] Care Management Care Management Coordinator [phone]

## 2022-12-06 DIAGNOSIS — E7800 Pure hypercholesterolemia, unspecified: Secondary | ICD-10-CM | POA: Diagnosis not present

## 2022-12-06 DIAGNOSIS — R7303 Prediabetes: Secondary | ICD-10-CM | POA: Diagnosis not present

## 2022-12-06 DIAGNOSIS — I10 Essential (primary) hypertension: Secondary | ICD-10-CM | POA: Diagnosis not present

## 2022-12-13 DIAGNOSIS — Z9189 Other specified personal risk factors, not elsewhere classified: Secondary | ICD-10-CM | POA: Diagnosis not present

## 2022-12-13 DIAGNOSIS — E669 Obesity, unspecified: Secondary | ICD-10-CM | POA: Diagnosis not present

## 2022-12-13 DIAGNOSIS — Z0000 Encounter for general adult medical examination without abnormal findings: Secondary | ICD-10-CM | POA: Diagnosis not present

## 2022-12-13 DIAGNOSIS — I10 Essential (primary) hypertension: Secondary | ICD-10-CM | POA: Diagnosis not present

## 2022-12-13 DIAGNOSIS — R7303 Prediabetes: Secondary | ICD-10-CM | POA: Diagnosis not present

## 2022-12-13 DIAGNOSIS — Z6838 Body mass index (BMI) 38.0-38.9, adult: Secondary | ICD-10-CM | POA: Diagnosis not present

## 2022-12-13 DIAGNOSIS — Z1331 Encounter for screening for depression: Secondary | ICD-10-CM | POA: Diagnosis not present

## 2022-12-13 DIAGNOSIS — E785 Hyperlipidemia, unspecified: Secondary | ICD-10-CM | POA: Diagnosis not present

## 2022-12-14 ENCOUNTER — Other Ambulatory Visit: Payer: Self-pay | Admitting: Family Medicine

## 2022-12-14 DIAGNOSIS — Z9189 Other specified personal risk factors, not elsewhere classified: Secondary | ICD-10-CM

## 2022-12-14 DIAGNOSIS — Z0000 Encounter for general adult medical examination without abnormal findings: Secondary | ICD-10-CM

## 2022-12-21 ENCOUNTER — Ambulatory Visit
Admission: RE | Admit: 2022-12-21 | Discharge: 2022-12-21 | Disposition: A | Discharge status: Home / Self Care | Payer: Medicare HMO | Source: Ambulatory Visit | Attending: Family Medicine | Admitting: Family Medicine

## 2022-12-21 DIAGNOSIS — Z0000 Encounter for general adult medical examination without abnormal findings: Secondary | ICD-10-CM | POA: Insufficient documentation

## 2022-12-21 DIAGNOSIS — Z9189 Other specified personal risk factors, not elsewhere classified: Secondary | ICD-10-CM | POA: Insufficient documentation

## 2023-01-18 ENCOUNTER — Encounter: Payer: Self-pay | Admitting: Urology

## 2023-01-18 ENCOUNTER — Ambulatory Visit: Payer: Medicare HMO | Admitting: Urology

## 2023-01-18 ENCOUNTER — Other Ambulatory Visit
Admission: RE | Admit: 2023-01-18 | Discharge: 2023-01-18 | Disposition: A | Discharge status: Home / Self Care | Payer: Medicare HMO | Attending: Urology | Admitting: Urology

## 2023-01-18 VITALS — BP 128/80 | HR 77 | Ht 66.5 in | Wt 235.0 lb

## 2023-01-18 DIAGNOSIS — C61 Malignant neoplasm of prostate: Secondary | ICD-10-CM | POA: Diagnosis not present

## 2023-01-18 DIAGNOSIS — N393 Stress incontinence (female) (male): Secondary | ICD-10-CM | POA: Diagnosis not present

## 2023-01-18 LAB — PSA: Prostatic Specific Antigen: <0.01 ng/mL (ref 0.00–4.00)

## 2023-01-18 MED ORDER — LEUPROLIDE ACETATE (6 MONTH) 45 MG ~~LOC~~ KIT
45.0000 mg | PACK | Freq: Once | SUBCUTANEOUS | Status: AC
Start: 2023-01-18 — End: 2023-01-18
  Administered 2023-01-18: 45 mg via SUBCUTANEOUS

## 2023-01-18 NOTE — Progress Notes (Signed)
Eligard SubQ Injection   Due to Prostate Cancer patient is present today for a Eligard Injection.  Medication: Eligard 6 month Dose: 45 mg  Location: right arm Lot: [id number] Exp: 02/2024  Patient tolerated well, no complications were noted.  Performed by: Debbe Bales, CMA   Per Dr. Richardo Hanks patient is to continue therapy for 1 year . Patient's next follow up was scheduled for July 27, 2023. This appointment was scheduled using wheel and given to patient today along with reminder continue on Vitamin D 800-1000iu and Calcium 1000-1200mg  daily while on Androgen Deprivation Therapy.  PA approval dates: No PA required

## 2023-01-18 NOTE — Progress Notes (Signed)
   01/18/2023 12:06 PM   William Duffy 02-29-1956 [id number]  Reason for visit: Follow up high risk prostate cancer, stress incontinence  HPI: Healthy 67 year old male with a PSA of 48 who was diagnosed with high risk prostate cancer (5+4=9) on prostate biopsy with Dr. Lonna Cobb, and PSMA PET scan showed no definite evidence of metastatic disease, but suspected seminal vesicle involvement.  Using shared decision making with his good health, he opted for radical prostatectomy with the understanding of high likelihood of extraprostatic extension with positive margins and need for additional treatments including likely radiation and ADT.   He underwent robotic radical prostatectomy and bilateral pelvic lymph node dissection on 09/14/2021.  Pathology showed 5+4=9 grade group 5 disease with extension through the seminal vesicles posteriorly.  Pelvic lymph nodes were negative for metastatic disease bilaterally.  At that time I recommended close follow-up in 1 month for PSA, with plan for referral for ADT and referral to radiation oncology if PSA remained detectable, but unfortunately he did not follow-up.  PSA 01/08/2022 was significantly elevated at 24.5 indicating residual disease.  Urinalysis and urine culture were negative.  PSMA PET scan on 01/26/2022 showed significant radiotracer activity superior to the prostatectomy bed consistent with his extraprostatic extension identified at time of surgery, as well as a single radiotracer avid right external iliac lymph node that was increased in size consistent with nodal metastasis.  He received initial Eligard 41-month injection 01/19/2022, with plan to continue for at least 2 years.  Eligard 63-month injection given again today.  He completed radiation therapy to his prostatic fossa and pelvic nodes with Dr. Rushie Chestnut in October 2023.  PSA from January 2024 was very low at 0.04, PSA today is pending.  Overall he feels well.  He initially had some hot flashes  with the Eligard, but those have since resolved since he started vitamin E supplementation.  He denies any decrease in energy or other side effects from the ADT.  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  He reports stress urinary incontinence that continues to require a few pads per day, he trialed Cunningham clamp but this was very bothersome.  He denies any pelvic pain or dysuria.  He also drinks a fair amount of energy drinks, which likely contributed to his urinary symptoms.  He had significant ED refractory to PDE 5 inhibitors prior to prostate cancer surgery and treatment, and is not interested in further ED treatments at this time.  We again reviewed his pathology with very high risk disease, potential need for referral to oncology in the future pending PSA values to consider additional treatments.  PSA will be drawn today and call with those results.  -RTC 6 months to continue ADT(01/2022 -at least 01/2024) -48-month ADT injection given today -Referral placed to Dr. Lafonda Mosses in [place] to consider surgical options persistent stress incontinence -PSA today, call with results, if elevated will also refer to oncology to consider additional therapies  Sondra Come, MD  [facility] [address], [address] [place], [place] [address] [phone]

## 2023-03-03 DIAGNOSIS — N393 Stress incontinence (female) (male): Secondary | ICD-10-CM | POA: Diagnosis not present

## 2023-03-17 ENCOUNTER — Encounter: Payer: Self-pay | Admitting: Gastroenterology

## 2023-03-18 ENCOUNTER — Ambulatory Visit: Payer: Medicare HMO | Admitting: Anesthesiology

## 2023-03-18 ENCOUNTER — Encounter: Payer: Self-pay | Admitting: Gastroenterology

## 2023-03-18 ENCOUNTER — Encounter
Admission: RE | Disposition: A | Discharge status: Home / Self Care | Payer: Self-pay | Source: Home / Self Care | Attending: Gastroenterology

## 2023-03-18 ENCOUNTER — Ambulatory Visit
Admission: RE | Admit: 2023-03-18 | Discharge: 2023-03-18 | Disposition: A | Discharge status: Home / Self Care | Payer: Medicare HMO | Attending: Gastroenterology | Admitting: Gastroenterology

## 2023-03-18 ENCOUNTER — Other Ambulatory Visit: Payer: Self-pay

## 2023-03-18 DIAGNOSIS — K627 Radiation proctitis: Secondary | ICD-10-CM | POA: Diagnosis not present

## 2023-03-18 DIAGNOSIS — K5520 Angiodysplasia of colon without hemorrhage: Secondary | ICD-10-CM | POA: Insufficient documentation

## 2023-03-18 DIAGNOSIS — K649 Unspecified hemorrhoids: Secondary | ICD-10-CM | POA: Diagnosis not present

## 2023-03-18 DIAGNOSIS — E7800 Pure hypercholesterolemia, unspecified: Secondary | ICD-10-CM | POA: Diagnosis not present

## 2023-03-18 DIAGNOSIS — Z86010 Personal history of colonic polyps: Secondary | ICD-10-CM | POA: Diagnosis not present

## 2023-03-18 DIAGNOSIS — K5730 Diverticulosis of large intestine without perforation or abscess without bleeding: Secondary | ICD-10-CM | POA: Insufficient documentation

## 2023-03-18 DIAGNOSIS — D124 Benign neoplasm of descending colon: Secondary | ICD-10-CM | POA: Diagnosis not present

## 2023-03-18 DIAGNOSIS — Z79899 Other long term (current) drug therapy: Secondary | ICD-10-CM | POA: Diagnosis not present

## 2023-03-18 DIAGNOSIS — Z1211 Encounter for screening for malignant neoplasm of colon: Secondary | ICD-10-CM | POA: Insufficient documentation

## 2023-03-18 DIAGNOSIS — M109 Gout, unspecified: Secondary | ICD-10-CM | POA: Insufficient documentation

## 2023-03-18 DIAGNOSIS — D122 Benign neoplasm of ascending colon: Secondary | ICD-10-CM | POA: Diagnosis not present

## 2023-03-18 DIAGNOSIS — I10 Essential (primary) hypertension: Secondary | ICD-10-CM | POA: Diagnosis not present

## 2023-03-18 DIAGNOSIS — K640 First degree hemorrhoids: Secondary | ICD-10-CM | POA: Insufficient documentation

## 2023-03-18 DIAGNOSIS — Z6836 Body mass index (BMI) 36.0-36.9, adult: Secondary | ICD-10-CM | POA: Diagnosis not present

## 2023-03-18 DIAGNOSIS — E669 Obesity, unspecified: Secondary | ICD-10-CM | POA: Insufficient documentation

## 2023-03-18 DIAGNOSIS — E119 Type 2 diabetes mellitus without complications: Secondary | ICD-10-CM | POA: Insufficient documentation

## 2023-03-18 DIAGNOSIS — Z8546 Personal history of malignant neoplasm of prostate: Secondary | ICD-10-CM | POA: Insufficient documentation

## 2023-03-18 DIAGNOSIS — K635 Polyp of colon: Secondary | ICD-10-CM | POA: Diagnosis not present

## 2023-03-18 HISTORY — PX: HEMOSTASIS CLIP PLACEMENT: SHX6857

## 2023-03-18 HISTORY — PX: COLONOSCOPY WITH PROPOFOL: SHX5780

## 2023-03-18 HISTORY — PX: POLYPECTOMY: SHX5525

## 2023-03-18 SURGERY — COLONOSCOPY WITH PROPOFOL
Anesthesia: General

## 2023-03-18 MED ORDER — DEXMEDETOMIDINE HCL IN NACL 80 MCG/20ML IV SOLN
INTRAVENOUS | Status: AC
  Filled 2023-03-18: qty 20

## 2023-03-18 MED ORDER — PROPOFOL 500 MG/50ML IV EMUL
INTRAVENOUS | Status: DC | PRN
  Administered 2023-03-18: 120 ug/kg/min via INTRAVENOUS

## 2023-03-18 MED ORDER — PROPOFOL 10 MG/ML IV BOLUS
INTRAVENOUS | Status: DC | PRN
Start: 2023-03-18 — End: 2023-03-18
  Administered 2023-03-18: 100 mg via INTRAVENOUS

## 2023-03-18 MED ORDER — LIDOCAINE HCL (CARDIAC) PF 100 MG/5ML IV SOSY
PREFILLED_SYRINGE | INTRAVENOUS | Status: DC | PRN
  Administered 2023-03-18: 50 mg via INTRAVENOUS

## 2023-03-18 MED ORDER — PHENYLEPHRINE 80 MCG/ML (10ML) SYRINGE FOR IV PUSH (FOR BLOOD PRESSURE SUPPORT)
PREFILLED_SYRINGE | INTRAVENOUS | Status: AC
  Filled 2023-03-18: qty 10

## 2023-03-18 MED ORDER — SODIUM CHLORIDE 0.9 % IV SOLN: INTRAVENOUS | Status: DC

## 2023-03-18 MED ORDER — PHENYLEPHRINE 80 MCG/ML (10ML) SYRINGE FOR IV PUSH (FOR BLOOD PRESSURE SUPPORT)
PREFILLED_SYRINGE | INTRAVENOUS | Status: DC | PRN
  Administered 2023-03-18 (×4): 80 ug via INTRAVENOUS

## 2023-03-18 NOTE — Op Note (Signed)
[facility] Patient Name: William Duffy Procedure Date: 03/18/2023 8:57 AM MRN: [id number] Account #: [id number] Date of Birth: [birth year] Admit Type: Outpatient Age: 67 Room: [facility] ENDO ROOM 1 Gender: Male Note Status: Finalized Instrument Name: Prentice Docker [id number] Procedure:             Colonoscopy Indications:           High risk colon cancer surveillance: Personal history                         of adenoma with high grade dysplasia, High risk colon                         cancer surveillance: Personal history of adenoma with                         villous component Providers:             Trenda Moots, DO Referring MD:          Marisue Ivan (Referring MD) Medicines:             Monitored Anesthesia Care Complications:         No immediate complications. Estimated blood loss:                         Minimal. Procedure:             Pre-Anesthesia Assessment:                        - Prior to the procedure, a History and Physical was                         performed, and patient medications and allergies were                         reviewed. The patient is competent. The risks and                         benefits of the procedure and the sedation options and                         risks were discussed with the patient. All questions                         were answered and informed consent was obtained.                         Patient identification and proposed procedure were                         verified by the physician, the nurse, the anesthetist                         and the technician in the endoscopy suite. Mental                         Status Examination: alert and oriented. Airway  Examination: normal oropharyngeal airway and neck                         mobility. Respiratory Examination: clear to                         auscultation. CV Examination: RRR, no murmurs, no S3                          or S4. Prophylactic Antibiotics: The patient does not                         require prophylactic antibiotics. Prior                         Anticoagulants: The patient has taken no anticoagulant                         or antiplatelet agents. ASA Grade Assessment: II - A                         patient with mild systemic disease. After reviewing                         the risks and benefits, the patient was deemed in                         satisfactory condition to undergo the procedure. The                         anesthesia plan was to use monitored anesthesia care                         (MAC). Immediately prior to administration of                         medications, the patient was re-assessed for adequacy                         to receive sedatives. The heart rate, respiratory                         rate, oxygen saturations, blood pressure, adequacy of                         pulmonary ventilation, and response to care were                         monitored throughout the procedure. The physical                         status of the patient was re-assessed after the                         procedure.                        After obtaining informed consent, the colonoscope was  passed under direct vision. Throughout the procedure,                         the patient's blood pressure, pulse, and oxygen                         saturations were monitored continuously. The                         Colonoscope was introduced through the anus and                         advanced to the the terminal ileum, with                         identification of the appendiceal orifice and IC                         valve. The colonoscopy was performed without                         difficulty. The patient tolerated the procedure well.                         The quality of the bowel preparation was evaluated                         using the BBPS [facility] Bowel  Preparation Scale) with                         scores of: Right Colon = 2 (minor amount of residual                         staining, small fragments of stool and/or opaque                         liquid, but mucosa seen well), Transverse Colon = 3                         (entire mucosa seen well with no residual staining,                         small fragments of stool or opaque liquid) and Left                         Colon = 2 (minor amount of residual staining, small                         fragments of stool and/or opaque liquid, but mucosa                         seen well). The total BBPS score equals 7. The quality                         of the bowel preparation was good. The terminal ileum,  ileocecal valve, appendiceal orifice, and rectum were                         photographed. Findings:      The perianal and digital rectal examinations were normal. Pertinent       negatives include normal sphincter tone.      The terminal ileum appeared normal. Estimated blood loss: none.      Retroflexion in the right colon was performed.      A 12 to 13 mm polyp was found in the ascending colon. The polyp was       sessile. The polyp was removed with a hot snare. Resection and retrieval       were complete. STSC applied to edges of the polypectomy site. To prevent       bleeding after the polypectomy, one hemostatic clip was successfully       placed (MR conditional). There was no bleeding at the end of the       procedure. Estimated blood loss was minimal.      Two sessile polyps were found in the descending colon. The polyps were 3       to 5 mm in size. These polyps were removed with a cold snare. Resection       and retrieval were complete. Estimated blood loss was minimal.      A 1 to 2 mm polyp was found in the descending colon. The polyp was       sessile. The polyp was removed with a jumbo cold forceps. Resection and       retrieval were complete.  Estimated blood loss was minimal.      A few small-mouthed diverticula were found in the recto-sigmoid colon.       Estimated blood loss: none.      Non-bleeding internal hemorrhoids were found during retroflexion. The       hemorrhoids were Grade I (internal hemorrhoids that do not prolapse).       Estimated blood loss: none.      Multiple small localized angioectasias without bleeding were found in       the rectum. Consistent with radiation proctitis. Estimated blood loss:       none.      The exam was otherwise without abnormality on direct and retroflexion       views. Impression:            - The examined portion of the ileum was normal.                        - One 12 to 13 mm polyp in the ascending colon,                         removed with a hot snare. Resected and retrieved. Clip                         (MR conditional) was placed.                        - Two 3 to 5 mm polyps in the descending colon,                         removed with a cold snare. Resected and retrieved.                        -  One 1 to 2 mm polyp in the descending colon, removed                         with a jumbo cold forceps. Resected and retrieved.                        - Diverticulosis in the recto-sigmoid colon.                        - Non-bleeding internal hemorrhoids.                        - Multiple non-bleeding colonic angioectasias.                        - The examination was otherwise normal on direct and                         retroflexion views. Recommendation:        - Patient has a contact number available for                         emergencies. The signs and symptoms of potential                         delayed complications were discussed with the patient.                         Return to normal activities tomorrow. Written                         discharge instructions were provided to the patient.                        - Discharge patient to home.                        -  Resume previous diet.                        - Continue present medications.                        - No ibuprofen, naproxen, or other non-steroidal                         anti-inflammatory drugs for 5 days after polyp removal.                        - Await pathology results.                        - Repeat colonoscopy for surveillance based on                         pathology results.                        - Return to referring physician as previously  scheduled.                        - The findings and recommendations were discussed with                         the patient. Procedure Code(s):     --- Professional ---                        [phone], Colonoscopy, flexible; with removal of                         tumor(s), polyp(s), or other lesion(s) by snare                         technique                        45380, 59, Colonoscopy, flexible; with biopsy, single                         or multiple Diagnosis Code(s):     --- Professional ---                        K64.0, First degree hemorrhoids                        D12.2, Benign neoplasm of ascending colon                        D12.4, Benign neoplasm of descending colon                        K55.20, Angiodysplasia of colon without hemorrhage                        Z86.010, Personal history of colonic polyps                        K57.30, Diverticulosis of large intestine without                         perforation or abscess without bleeding CPT copyright 2022 American Medical Association. All rights reserved. The codes documented in this report are preliminary and upon coder review may  be revised to meet current compliance requirements. Attending Participation:      I personally performed the entire procedure. Elfredia Nevins, DO Jaynie Collins DO, DO 03/18/2023 10:03:01 AM This report has been signed electronically. Number of Addenda: 0 Note Initiated On: 03/18/2023 8:57 AM Scope Withdrawal  Time: 0 hours 33 minutes 22 seconds  Total Procedure Duration: 0 hours 35 minutes 45 seconds  Estimated Blood Loss:  Estimated blood loss was minimal.      [facility]

## 2023-03-18 NOTE — H&P (Signed)
Pre-Procedure H&P   Patient ID: William Duffy is a 67 y.o. male.  Gastroenterology Provider: Jaynie Collins, DO  Referring Provider: Dr. Burnadette Pop PCP: Marisue Ivan, MD  Date: 03/18/2023  HPI William Duffy is a 67 y.o. male who presents today for Colonoscopy for Surveillance-personal history of colon polyps .  Personal history of colon polyps.  Last underwent colonoscopy in December 2018 which was normal. 05/2015 adenomatous polyps, 01/2015 TVA with high-grade dysplasia, 62,011 diverticulosis and internal hemorrhoids, 12/2006 negative  Bowel movements are regular without melena or hematochezia  Family history of polyps in his mother father and brother.  No family history of colon cancer  Hemoglobin 12.6 MCV 92 platelets 251,000   Past Medical History:  Diagnosis Date   Acute lateral meniscus tear of right knee    Alcohol abuse    Colon adenomas    Diet-controlled diabetes mellitus (HCC)    pre-diabetic   Diverticulosis    H/O drug abuse (HCC)    H/O: gout    Hepatitis    hepatitis c   Hypercholesteremia    Hypertension    Internal hemorrhoids    Liver disease    Neutropenia (HCC)    Obesity    Prostate cancer [facility])     Past Surgical History:  Procedure Laterality Date   CATARACT EXTRACTION W/PHACO Left 03/29/2019   Procedure: CATARACT EXTRACTION PHACO AND INTRAOCULAR LENS PLACEMENT (IOC)  LEFT;  Surgeon: Elliot Cousin, MD;  Location: [facility] ORS;  Service: Ophthalmology;  Laterality: Left;  [place] 01:35.7 AP% 14.6 CDE 16.14 Fluid Pack Lot #[id number]   COLONOSCOPY WITH PROPOFOL N/A 01/03/2015   Procedure: COLONOSCOPY WITH PROPOFOL;  Surgeon: Christena Deem, MD;  Location: [facility] ENDOSCOPY;  Service: Endoscopy;  Laterality: N/A;   COLONOSCOPY WITH PROPOFOL N/A 05/06/2015   Procedure: COLONOSCOPY WITH PROPOFOL;  Surgeon: Christena Deem, MD;  Location: [facility] ENDOSCOPY;  Service: Endoscopy;  Laterality: N/A;   COLONOSCOPY WITH PROPOFOL N/A 06/06/2017    Procedure: COLONOSCOPY WITH PROPOFOL;  Surgeon: Christena Deem, MD;  Location: [facility] ENDOSCOPY;  Service: Endoscopy;  Laterality: N/A;   KNEE ARTHROSCOPY Right    LIVER BIOPSY     MENISCUS REPAIR     PELVIC LYMPH NODE DISSECTION Bilateral 09/14/2021   Procedure: PELVIC LYMPH NODE DISSECTION;  Surgeon: Sondra Come, MD;  Location: [facility] ORS;  Service: Urology;  Laterality: Bilateral;   PROSTATE BIOPSY N/A 07/21/2021   Procedure: PROSTATE BIOPSY;  Surgeon: Riki Altes, MD;  Location: [facility] ORS;  Service: Urology;  Laterality: N/A;   ROBOT ASSISTED LAPAROSCOPIC RADICAL PROSTATECTOMY N/A 09/14/2021   Procedure: XI ROBOTIC ASSISTED LAPAROSCOPIC RADICAL PROSTATECTOMY;  Surgeon: Sondra Come, MD;  Location: [facility] ORS;  Service: Urology;  Laterality: N/A;   TRANSRECTAL ULTRASOUND N/A 07/21/2021   Procedure: TRANSRECTAL ULTRASOUND;  Surgeon: Riki Altes, MD;  Location: [facility] ORS;  Service: Urology;  Laterality: N/A;    Family History No h/o GI disease or malignancy  Review of Systems  Constitutional:  Negative for activity change, appetite change, chills, diaphoresis, fatigue, fever and unexpected weight change.  HENT:  Negative for trouble swallowing and voice change.   Respiratory:  Negative for shortness of breath and wheezing.   Cardiovascular:  Negative for chest pain, palpitations and leg swelling.  Gastrointestinal:  Negative for abdominal distention, abdominal pain, anal bleeding, blood in stool, constipation, diarrhea, nausea and vomiting.  Musculoskeletal:  Negative for arthralgias and myalgias.  Skin:  Negative for color change and pallor.  Neurological:  Negative for dizziness, syncope and weakness.  Psychiatric/Behavioral:  Negative for confusion. The patient is not nervous/anxious.   All other systems reviewed and are negative.    Medications No current facility-administered medications on file prior to encounter.   Current Outpatient Medications on File Prior to  Encounter  Medication Sig Dispense Refill   latanoprost (XALATAN) 0.005 % ophthalmic solution SMARTSIG:In Eye(s)     losartan (COZAAR) 100 MG tablet Take 100 mg by mouth daily.     lovastatin (MEVACOR) 40 MG tablet Take 40 mg by mouth at bedtime.     brimonidine (ALPHAGAN) 0.2 % ophthalmic solution SMARTSIG:In Eye(s)     indomethacin (INDOCIN) 50 MG capsule Take 50 mg by mouth 2 (two) times daily as needed (gout flare).      Vitamin E (VITAMIN E/D-ALPHA NATURAL) 268 MG (400 UNIT) CAPS Take by mouth.      Pertinent medications related to GI and procedure were reviewed by me with the patient prior to the procedure   Current Facility-Administered Medications:    0.9 %  sodium chloride infusion, , Intravenous, Continuous, Jaynie Collins, DO, Last Rate: 20 mL/hr at 03/18/23 0906, New Bag at 03/18/23 0906  sodium chloride 20 mL/hr at 03/18/23 9604       Allergies  Allergen Reactions   Shrimp [Shellfish Allergy] Hives and Swelling    Throat swells   Allergies were reviewed by me prior to the procedure  Objective   Body mass index is 36.73 kg/m. Vitals:   03/18/23 0906  Pulse: 81  Resp: 20  Temp: (!) 96.4 F (35.8 C)  TempSrc: Temporal  Weight: 104.8 kg  Height: 5' 6.5" (1.689 m)     Physical Exam Vitals and nursing note reviewed.  Constitutional:      General: He is not in acute distress.    Appearance: He is not ill-appearing, toxic-appearing or diaphoretic.  HENT:     Head: Normocephalic and atraumatic.     Nose: Nose normal.     Mouth/Throat:     Mouth: Mucous membranes are moist.     Pharynx: Oropharynx is clear.  Eyes:     General: No scleral icterus.    Extraocular Movements: Extraocular movements intact.  Cardiovascular:     Rate and Rhythm: Normal rate and regular rhythm.     Heart sounds: Normal heart sounds. No murmur heard.    No friction rub. No gallop.  Pulmonary:     Effort: Pulmonary effort is normal. No respiratory distress.     Breath  sounds: Normal breath sounds. No wheezing, rhonchi or rales.  Abdominal:     General: Abdomen is flat. Bowel sounds are normal. There is no distension.     Palpations: Abdomen is soft.     Tenderness: There is no abdominal tenderness. There is no guarding or rebound.  Musculoskeletal:     Cervical back: Neck supple.     Right lower leg: No edema.     Left lower leg: No edema.  Skin:    General: Skin is warm and dry.     Coloration: Skin is not jaundiced or pale.  Neurological:     General: No focal deficit present.     Mental Status: He is alert and oriented to person, place, and time. Mental status is at baseline.  Psychiatric:        Mood and Affect: Mood normal.        Behavior: Behavior normal.        Thought Content:  Thought content normal.        Judgment: Judgment normal.      Assessment:  Mr. Zacherie Lagunes is a 67 y.o. male  who presents today for Colonoscopy for Surveillance-personal history of colon polyps .  Plan:  Colonoscopy with possible intervention today  Colonoscopy with possible biopsy, control of bleeding, polypectomy, and interventions as necessary has been discussed with the patient/patient representative. Informed consent was obtained from the patient/patient representative after explaining the indication, nature, and risks of the procedure including but not limited to death, bleeding, perforation, missed neoplasm/lesions, cardiorespiratory compromise, and reaction to medications. Opportunity for questions was given and appropriate answers were provided. Patient/patient representative has verbalized understanding is amenable to undergoing the procedure.   Jaynie Collins, DO  [facility]  Portions of the record may have been created with voice recognition software. Occasional wrong-word or 'sound-a-like' substitutions may have occurred due to the inherent limitations of voice recognition software.  Read the chart carefully and  recognize, using context, where substitutions may have occurred.

## 2023-03-18 NOTE — Anesthesia Postprocedure Evaluation (Signed)
Anesthesia Post Note  Patient: William Duffy  Procedure(s) Performed: COLONOSCOPY WITH PROPOFOL POLYPECTOMY HEMOSTASIS CLIP PLACEMENT  Patient location during evaluation: PACU Anesthesia Type: General Level of consciousness: awake and alert Pain management: pain level controlled Vital Signs Assessment: post-procedure vital signs reviewed and stable Respiratory status: spontaneous breathing, nonlabored ventilation, respiratory function stable and patient connected to nasal cannula oxygen Cardiovascular status: blood pressure returned to baseline and stable Postop Assessment: no apparent nausea or vomiting Anesthetic complications: no  No notable events documented.   Last Vitals:  Vitals:   03/18/23 0906 03/18/23 0958  BP:  107/88  Pulse: 81 83  Resp: 20 13  Temp: (!) 35.8 C   SpO2:  98%    Last Pain:  Vitals:   03/18/23 0958  TempSrc:   PainSc: 0-No pain                 Stephanie Coup

## 2023-03-18 NOTE — Anesthesia Preprocedure Evaluation (Signed)
Anesthesia Evaluation  Patient identified by MRN, date of birth, ID band Patient awake    Reviewed: Allergy & Precautions, NPO status , Patient's Chart, lab work & pertinent test results  Airway Mallampati: III  TM Distance: >3 FB Neck ROM: Full    Dental  (+) Partial Upper   Pulmonary neg pulmonary ROS   Pulmonary exam normal breath sounds clear to auscultation       Cardiovascular Exercise Tolerance: Good hypertension, Pt. on medications negative cardio ROS Normal cardiovascular exam Rhythm:Regular Rate:Normal     Neuro/Psych negative neurological ROS  negative psych ROS   GI/Hepatic negative GI ROS, Neg liver ROS,,,(+) Hepatitis -, C  Endo/Other  negative endocrine ROSdiabetes    Renal/GU      Musculoskeletal   Abdominal   Peds  Hematology negative hematology ROS (+)   Anesthesia Other Findings Past Medical History: No date: Acute lateral meniscus tear of right knee No date: Alcohol abuse No date: Colon adenomas No date: Diet-controlled diabetes mellitus (HCC)     Comment:  pre-diabetic No date: Diverticulosis No date: H/O drug abuse (HCC) No date: H/O: gout No date: Hepatitis     Comment:  hepatitis c No date: Hypercholesteremia No date: Hypertension No date: Internal hemorrhoids No date: Liver disease No date: Neutropenia (HCC) No date: Obesity No date: Prostate cancer [facility])  Past Surgical History: 03/29/2019: CATARACT EXTRACTION W/PHACO; Left     Comment:  Procedure: CATARACT EXTRACTION PHACO AND INTRAOCULAR               LENS PLACEMENT (IOC)  LEFT;  Surgeon: Elliot Cousin, MD;               Location: [facility] ORS;  Service: Ophthalmology;  Laterality:              Left;  [place] 01:35.7 AP% 14.6 CDE 16.14 Fluid Pack Lot               #[id number] 01/03/2015: COLONOSCOPY WITH PROPOFOL; N/A     Comment:  Procedure: COLONOSCOPY WITH PROPOFOL;  Surgeon: Christena Deem, MD;  Location: [facility]  ENDOSCOPY;  Service:               Endoscopy;  Laterality: N/A; 05/06/2015: COLONOSCOPY WITH PROPOFOL; N/A     Comment:  Procedure: COLONOSCOPY WITH PROPOFOL;  Surgeon: Christena Deem, MD;  Location: [facility] ENDOSCOPY;  Service:               Endoscopy;  Laterality: N/A; 06/06/2017: COLONOSCOPY WITH PROPOFOL; N/A     Comment:  Procedure: COLONOSCOPY WITH PROPOFOL;  Surgeon:               Christena Deem, MD;  Location: [facility] ENDOSCOPY;                Service: Endoscopy;  Laterality: N/A; No date: KNEE ARTHROSCOPY; Right No date: LIVER BIOPSY No date: MENISCUS REPAIR 07/21/2021: PROSTATE BIOPSY; N/A     Comment:  Procedure: PROSTATE BIOPSY;  Surgeon: Riki Altes,               MD;  Location: [facility] ORS;  Service: Urology;  Laterality:               N/A; 07/21/2021: TRANSRECTAL ULTRASOUND; N/A     Comment:  Procedure: TRANSRECTAL ULTRASOUND;  Surgeon: Lonna Cobb,  Verna Czech, MD;  Location: [facility] ORS;  Service: Urology;                Laterality: N/A;  BMI    Body Mass Index: 36.32 kg/m      Reproductive/Obstetrics negative OB ROS                             Anesthesia Physical Anesthesia Plan  ASA: 2  Anesthesia Plan: General   Post-op Pain Management: Minimal or no pain anticipated   Induction: Intravenous  PONV Risk Score and Plan: 3 and Propofol infusion, TIVA and Ondansetron  Airway Management Planned: Nasal Cannula  Additional Equipment: None  Intra-op Plan:   Post-operative Plan:   Informed Consent: I have reviewed the patients History and Physical, chart, labs and discussed the procedure including the risks, benefits and alternatives for the proposed anesthesia with the patient or authorized representative who has indicated his/her understanding and acceptance.     Dental advisory given  Plan Discussed with: CRNA and Surgeon  Anesthesia Plan Comments: (Discussed risks of anesthesia with patient, including  possibility of difficulty with spontaneous ventilation under anesthesia necessitating airway intervention, PONV, and rare risks such as cardiac or respiratory or neurological events, and allergic reactions. Discussed the role of CRNA in patient's perioperative care. Patient understands.)       Anesthesia Quick Evaluation

## 2023-03-18 NOTE — Interval H&P Note (Signed)
History and Physical Interval Note: Preprocedure H&P from 03/18/23  was reviewed and there was no interval change after seeing and examining the patient.  Written consent was obtained from the patient after discussion of risks, benefits, and alternatives. Patient has consented to proceed with Colonoscopy with possible intervention   03/18/2023 9:09 AM  [name]  has presented today for surgery, with the diagnosis of Z86.010 (ICD-10-CM) - History of adenomatous polyp of colon.  The various methods of treatment have been discussed with the patient and family. After consideration of risks, benefits and other options for treatment, the patient has consented to  Procedure(s): COLONOSCOPY WITH PROPOFOL (N/A) as a surgical intervention.  The patient's history has been reviewed, patient examined, no change in status, stable for surgery.  I have reviewed the patient's chart and labs.  Questions were answered to the patient's satisfaction.     William Duffy

## 2023-03-18 NOTE — Transfer of Care (Signed)
Immediate Anesthesia Transfer of Care Note  Patient: William Duffy  Procedure(s) Performed: COLONOSCOPY WITH PROPOFOL POLYPECTOMY HEMOSTASIS CLIP PLACEMENT  Patient Location: PACU and Endoscopy Unit  Anesthesia Type:General  Level of Consciousness: drowsy and patient cooperative  Airway & Oxygen Therapy: Patient Spontanous Breathing  Post-op Assessment: Report given to RN and Post -op Vital signs reviewed and stable  Post vital signs: Reviewed and stable  Last Vitals:  Vitals Value Taken Time  BP 107/88 03/18/23 0958  Temp    Pulse 75 03/18/23 0959  Resp 15 03/18/23 0959  SpO2 99 % 03/18/23 0959  Vitals shown include unfiled device data.  Last Pain:  Vitals:   03/18/23 0958  TempSrc:   PainSc: 0-No pain         Complications: No notable events documented.

## 2023-03-21 ENCOUNTER — Encounter: Payer: Self-pay | Admitting: Gastroenterology

## 2023-04-10 ENCOUNTER — Other Ambulatory Visit: Payer: Self-pay

## 2023-04-10 DIAGNOSIS — R000 Tachycardia, unspecified: Secondary | ICD-10-CM | POA: Diagnosis not present

## 2023-04-18 DIAGNOSIS — M778 Other enthesopathies, not elsewhere classified: Secondary | ICD-10-CM | POA: Diagnosis not present

## 2023-06-09 DIAGNOSIS — I10 Essential (primary) hypertension: Secondary | ICD-10-CM | POA: Diagnosis not present

## 2023-06-09 DIAGNOSIS — R7303 Prediabetes: Secondary | ICD-10-CM | POA: Diagnosis not present

## 2023-06-09 DIAGNOSIS — E7800 Pure hypercholesterolemia, unspecified: Secondary | ICD-10-CM | POA: Diagnosis not present

## 2023-06-09 DIAGNOSIS — D638 Anemia in other chronic diseases classified elsewhere: Secondary | ICD-10-CM | POA: Diagnosis not present

## 2023-06-09 DIAGNOSIS — D708 Other neutropenia: Secondary | ICD-10-CM | POA: Diagnosis not present

## 2023-06-16 DIAGNOSIS — Z6838 Body mass index (BMI) 38.0-38.9, adult: Secondary | ICD-10-CM | POA: Diagnosis not present

## 2023-06-16 DIAGNOSIS — E1169 Type 2 diabetes mellitus with other specified complication: Secondary | ICD-10-CM | POA: Diagnosis not present

## 2023-06-16 DIAGNOSIS — D638 Anemia in other chronic diseases classified elsewhere: Secondary | ICD-10-CM | POA: Diagnosis not present

## 2023-06-16 DIAGNOSIS — E7800 Pure hypercholesterolemia, unspecified: Secondary | ICD-10-CM | POA: Diagnosis not present

## 2023-06-16 DIAGNOSIS — I10 Essential (primary) hypertension: Secondary | ICD-10-CM | POA: Diagnosis not present

## 2023-06-16 DIAGNOSIS — E119 Type 2 diabetes mellitus without complications: Secondary | ICD-10-CM | POA: Diagnosis not present

## 2023-06-16 DIAGNOSIS — M25521 Pain in right elbow: Secondary | ICD-10-CM | POA: Diagnosis not present

## 2023-06-16 DIAGNOSIS — D709 Neutropenia, unspecified: Secondary | ICD-10-CM | POA: Diagnosis not present

## 2023-06-24 DIAGNOSIS — M25521 Pain in right elbow: Secondary | ICD-10-CM | POA: Diagnosis not present

## 2023-06-24 DIAGNOSIS — M24021 Loose body in right elbow: Secondary | ICD-10-CM | POA: Diagnosis not present

## 2023-06-24 DIAGNOSIS — M19221 Secondary osteoarthritis, right elbow: Secondary | ICD-10-CM | POA: Diagnosis not present

## 2023-07-07 DIAGNOSIS — J209 Acute bronchitis, unspecified: Secondary | ICD-10-CM | POA: Diagnosis not present

## 2023-07-26 ENCOUNTER — Other Ambulatory Visit: Payer: Self-pay

## 2023-07-26 DIAGNOSIS — C61 Malignant neoplasm of prostate: Secondary | ICD-10-CM

## 2023-07-27 ENCOUNTER — Ambulatory Visit: Payer: Medicare HMO | Admitting: Urology

## 2023-08-04 ENCOUNTER — Other Ambulatory Visit
Admission: RE | Admit: 2023-08-04 | Discharge: 2023-08-04 | Disposition: A | Discharge status: Home / Self Care | Payer: Medicare HMO | Attending: Urology | Admitting: Urology

## 2023-08-04 DIAGNOSIS — C61 Malignant neoplasm of prostate: Secondary | ICD-10-CM | POA: Insufficient documentation

## 2023-08-04 LAB — PSA: Prostatic Specific Antigen: <0.01 ng/mL (ref 0.00–4.00)

## 2023-08-12 ENCOUNTER — Telehealth: Payer: Self-pay | Admitting: *Deleted

## 2023-08-12 NOTE — Patient Outreach (Signed)
  Care Coordination   Follow Up Visit Note   08/12/2023 Name: William Duffy MRN: [id number] DOB: 03/26/1956  William Duffy is a 68 y.o. year old male who sees Alla Amis, MD for primary care. I spoke with  Aida Louder by phone today.  What matters to the patients health and wellness today?  Patient state he is doing well, remains active with ongoing follow up by urology for prostate cancer. Denies any urgent concerns, encouraged to contact this care manager with questions.      Goals Addressed             This Visit's Progress    COMPLETED: RNCM: Effective Management of Prostate Cancer   On track    Care Coordination Interventions: Assessed patient understanding of cancer diagnosis and recommended treatment plan. Radiation treatment has completed.  He is doing well since surgery except for issues with leakage. State he was told this would slow down about 4-5 months post surgery but this has not happened.  He has a clamp but this is very uncomfortable, prefers not to use.  He has been using depends, this has helped.  He will discuss ongoing concern with provider next month.   Reviewed upcoming provider appointments and treatment appointments. Urology on 1/16, cancer center on 2/5, and PCP in March. No concerns with making appointments.  Assessed available transportation to appointments and treatments. Has consistent/reliable transportation: Yes Assessed support system. Has consistent/reliable family or other support: Yes The patient agrees to outreaches from the [facility]. The patient has the [facility] number to call for new questions, concerns, or needs. Review of the care coordination program and resources available.  He has continued to his exercise regime and is now back to refereeing basketball and football           SDOH assessments and interventions completed:  No     Care Coordination Interventions:  Yes, provided   Interventions Today    Flowsheet Row Most Recent  Value  Chronic Disease   Chronic disease during today's visit Other  [prostate cancer and incontinence]  General Interventions   General Interventions Discussed/Reviewed General Interventions Reviewed, Doctor Visits, Labs  Labs --  [Prostate level back to normal, no evidence of current cancer]  Doctor Visits Discussed/Reviewed Doctor Visits Reviewed, PCP, Specialist  [reviewed upcoming: urology 2/26, PCP 3/19.  Provided information for specialist that referral was previously sent to (Dr. Lovie) to discuss intervention for incontinence]  PCP/Specialist Visits Compliance with follow-up visit  Education Interventions   Education Provided Provided Education  Provided Verbal Education On When to see the doctor, Medication  [Meds reviewed, he will call provider if he start having any prostate issues, aware of when to seek medical attention]        Follow up plan: No further intervention required.   Encounter Outcome:  Patient Visit Completed   Odella Ku, RN, MSN, CCM [place]  [facility] Main, [facility] RN Care Coordinator Direct Dial: [phone] / Main [phone] Fax [phone] Email: [email or web address] Website: [email or web address]

## 2023-08-15 ENCOUNTER — Ambulatory Visit: Payer: Self-pay | Admitting: Urology

## 2023-08-15 DIAGNOSIS — N3942 Incontinence without sensory awareness: Secondary | ICD-10-CM | POA: Diagnosis not present

## 2023-08-15 DIAGNOSIS — N393 Stress incontinence (female) (male): Secondary | ICD-10-CM | POA: Diagnosis not present

## 2023-08-15 DIAGNOSIS — Z8546 Personal history of malignant neoplasm of prostate: Secondary | ICD-10-CM | POA: Diagnosis not present

## 2023-08-31 ENCOUNTER — Ambulatory Visit: Payer: Self-pay | Admitting: Urology

## 2023-08-31 ENCOUNTER — Ambulatory Visit (INDEPENDENT_AMBULATORY_CARE_PROVIDER_SITE_OTHER): Payer: Medicare HMO | Admitting: Urology

## 2023-08-31 VITALS — BP 149/91 | HR 77 | Ht 66.5 in

## 2023-08-31 DIAGNOSIS — C61 Malignant neoplasm of prostate: Secondary | ICD-10-CM | POA: Diagnosis not present

## 2023-08-31 DIAGNOSIS — N393 Stress incontinence (female) (male): Secondary | ICD-10-CM | POA: Diagnosis not present

## 2023-08-31 DIAGNOSIS — N3946 Mixed incontinence: Secondary | ICD-10-CM | POA: Diagnosis not present

## 2023-08-31 MED ORDER — LEUPROLIDE ACETATE (6 MONTH) 45 MG ~~LOC~~ KIT
45.0000 mg | PACK | Freq: Once | SUBCUTANEOUS | Status: AC
  Administered 2023-08-31: 45 mg via SUBCUTANEOUS

## 2023-08-31 NOTE — Progress Notes (Signed)
   08/31/2023 3:56 PM   William Duffy [birth year] [id number]  Reason for visit: Follow up high risk prostate cancer, stress incontinence  HPI: Healthy 68 year old male with a PSA of 48 who was diagnosed with high risk prostate cancer (5+4=9) on prostate biopsy with Dr. Lonna Cobb, and PSMA PET scan showed no definite evidence of metastatic disease, but suspected seminal vesicle involvement.Using shared decision making with his good health, he opted for radical prostatectomy with the understanding of high likelihood of extraprostatic extension with positive margins and need for additional treatments including likely radiation and ADT.He underwent robotic radical prostatectomy and bilateral pelvic lymph node dissection on 09/14/2021.  Pathology showed 5+4=9 grade group 5 disease with extension through the seminal vesicles posteriorly.  Pelvic lymph nodes were negative for metastatic disease bilaterally.  At that time I recommended close follow-up in 1 month for PSA, with plan for referral for ADT and referral to radiation oncology if PSA remained detectable, but unfortunately he did not follow-up. PSA 01/08/2022 was significantly elevated at 24.5 indicating residual disease. PSMA PET scan on 01/26/2022 showed significant radiotracer activity superior to the prostatectomy bed consistent with his extraprostatic extension identified at time of surgery, as well as a single radiotracer avid right external iliac lymph node that was increased in size consistent with nodal metastasis.  He received initial Eligard 23-month injection 01/19/2022, with plan to continue for at least 2 years, and completed radiation therapy to his prostatic fossa and pelvic nodes with Dr. Rushie Chestnut October 2023.  PSA has been undetectable since that time, including most recently on 08/04/2023.  Overall, he is doing well.  Tolerating ADT well and hot flashes have improved significantly.  He had severe ED prior to surgery and not interested in  further ED treatments at this time.  Continues to have moderate stress incontinence with 1 depends and 3 pads daily.  I reviewed the notes from Dr. Lafonda Mosses at [facility] urology in [place], and planning to move forward with artificial urinary sphincter in the near future.  Final 64-month ADT injection was given today to complete 2 years total. We again reviewed his pathology with very high risk disease, potential need for referral to medical oncology in the future pending PSA values to consider additional treatments.    -69-month ADT injection given today, this completes 2 years total and will monitor PSA closely -RTC 6 months PSA prior-> if PSA rises in the future will need referral to medical oncology   Sondra Come, MD  [facility] [address] [place], [place] [address] [phone]

## 2023-09-14 DIAGNOSIS — E785 Hyperlipidemia, unspecified: Secondary | ICD-10-CM | POA: Diagnosis not present

## 2023-09-14 DIAGNOSIS — E7800 Pure hypercholesterolemia, unspecified: Secondary | ICD-10-CM | POA: Diagnosis not present

## 2023-09-14 DIAGNOSIS — E1169 Type 2 diabetes mellitus with other specified complication: Secondary | ICD-10-CM | POA: Diagnosis not present

## 2023-09-14 DIAGNOSIS — I10 Essential (primary) hypertension: Secondary | ICD-10-CM | POA: Diagnosis not present

## 2023-09-14 DIAGNOSIS — D638 Anemia in other chronic diseases classified elsewhere: Secondary | ICD-10-CM | POA: Diagnosis not present

## 2023-09-16 DIAGNOSIS — Z8546 Personal history of malignant neoplasm of prostate: Secondary | ICD-10-CM | POA: Diagnosis not present

## 2023-09-16 DIAGNOSIS — N393 Stress incontinence (female) (male): Secondary | ICD-10-CM | POA: Diagnosis not present

## 2023-09-21 DIAGNOSIS — E1169 Type 2 diabetes mellitus with other specified complication: Secondary | ICD-10-CM | POA: Diagnosis not present

## 2023-09-21 DIAGNOSIS — D709 Neutropenia, unspecified: Secondary | ICD-10-CM | POA: Diagnosis not present

## 2023-09-21 DIAGNOSIS — E7800 Pure hypercholesterolemia, unspecified: Secondary | ICD-10-CM | POA: Diagnosis not present

## 2023-09-21 DIAGNOSIS — I10 Essential (primary) hypertension: Secondary | ICD-10-CM | POA: Diagnosis not present

## 2023-09-21 DIAGNOSIS — D638 Anemia in other chronic diseases classified elsewhere: Secondary | ICD-10-CM | POA: Diagnosis not present

## 2023-09-21 DIAGNOSIS — E119 Type 2 diabetes mellitus without complications: Secondary | ICD-10-CM | POA: Diagnosis not present

## 2023-10-10 ENCOUNTER — Other Ambulatory Visit: Payer: Self-pay | Admitting: Urology

## 2023-10-10 IMAGING — CT NM PET TUM IMG SKULL BASE T - THIGH
1 of 20 series · 1 of 25 positions shown · non-contrast
Comparison: MDP bone scan 08/04/2021
COMPARISON: MDP bone scan 08/04/2021

Addendum:
CLINICAL DATA: Prostate cancer. High risk staging. Abnormal MDP
bone scan.

EXAM:
NUCLEAR MEDICINE PET SKULL BASE TO THIGH
TECHNIQUE: 9.76 mCi F18 Piflufolastat (Pylarify) was injected intravenously.
Full-ring PET imaging was performed from the skull base to thigh
after the radiotracer. CT data was obtained and used for attenuation
correction and anatomic localization.

[Series 5: pet wb uncorrected (nac) · axial · 5.0mm · 4.07mm/px · 1 of 329 slices shown]
[im 1/329]
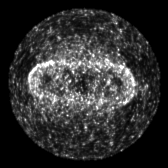

[1 of 25 positions shown; findings below may reference images not displayed]

FINDINGS: NECK

No radiotracer activity in neck lymph nodes.

Incidental CT finding: None

CHEST

Several small nodules are present along the RIGHT horizontal
fissure. The largest measures 5 mm (107/3) does not have radiotracer
activity.

No radiotracer avid mediastinal hilar nodes.

Incidental CT finding: 22 mm low-density nodule within the LEFT lobe
of the thyroid gland.

ABDOMEN/PELVIS

Prostate: Intense radiotracer activity near entirety of the prostate
gland with SUV max equal 15.7. Activity extends into the base of the
LEFT and RIGHT seminal vesicle.

Lymph nodes: Mild radiotracer activity associated with the small
RIGHT external iliac lymph node measuring 4 mm (image 232/series 3)
with SUV max equal 2.9.

Liver: No evidence of liver metastasis

Incidental CT finding: None

SKELETON

No focal activity to suggest skeletal metastasis. There is
attenuation artifact at the level of the kidneys which does obscure
evaluation of the upper lumbar spine. No lesion identified.
IMPRESSION: 1. Intense activity within the prostate gland consistent with
prostate adenocarcinoma.
2. Single small radiotracer avid RIGHT external iliac lymph node.
The activity is mild. Indeterminate finding.
3. No evidence skeletal metastasis.

ADDENDUM:
22 mm low-density nodule LEFT lobe of thyroid gland. Recommend
thyroid US (ref: [HOSPITAL]. [DATE]): 143-50).

These results will be called to the ordering clinician or
representative by the Radiologist Assistant, and communication
documented in the PACS or [REDACTED].

*** End of Addendum ***
FINDINGS: NECK

No radiotracer activity in neck lymph nodes.

Incidental CT finding: None

CHEST

Several small nodules are present along the RIGHT horizontal
fissure. The largest measures 5 mm (107/3) does not have radiotracer
activity.

No radiotracer avid mediastinal hilar nodes.

Incidental CT finding: 22 mm low-density nodule within the LEFT lobe
of the thyroid gland.

ABDOMEN/PELVIS

Prostate: Intense radiotracer activity near entirety of the prostate
gland with SUV max equal 15.7. Activity extends into the base of the
LEFT and RIGHT seminal vesicle.

Lymph nodes: Mild radiotracer activity associated with the small
RIGHT external iliac lymph node measuring 4 mm (image 232/series 3)
with SUV max equal 2.9.

Liver: No evidence of liver metastasis

Incidental CT finding: None

SKELETON

No focal activity to suggest skeletal metastasis. There is
attenuation artifact at the level of the kidneys which does obscure
evaluation of the upper lumbar spine. No lesion identified.
IMPRESSION: 1. Intense activity within the prostate gland consistent with
prostate adenocarcinoma.
2. Single small radiotracer avid RIGHT external iliac lymph node.
The activity is mild. Indeterminate finding.
3. No evidence skeletal metastasis.

## 2023-11-11 NOTE — Progress Notes (Signed)
 Anesthesia Review:  PCP: [place]- LOV 09/21/23  Cardiologist :  PPM/ ICD: Device Orders: Rep Notified:  Chest x-ray : EKG : 04/10/2023  Echo : Stress test: Cardiac Cath :   Activity level:  Sleep Study/ CPAP : Fasting Blood Sugar :      / Checks Blood Sugar -- times a day:     DM- type  Hgba1c-  09/14/23- 6.6  Metformin- none am of surgery   Blood Thinner/ Instructions /Last Dose: ASA / Instructions/ Last Dose :

## 2023-11-11 NOTE — Patient Instructions (Signed)
 SURGICAL WAITING ROOM VISITATION  Patients having surgery or a procedure may have no more than 2 support people in the waiting area - these visitors may rotate.    Children under the age of 27 must have an adult with them who is not the patient.  Due to an increase in RSV and influenza rates and associated hospitalizations, children ages 2 and under may not visit patients in [facility] hospitals.  Visitors with respiratory illnesses are discouraged from visiting and should remain at home.  If the patient needs to stay at the hospital during part of their recovery, the visitor guidelines for inpatient rooms apply. Pre-op nurse will coordinate an appropriate time for 1 support person to accompany patient in pre-op.  This support person may not rotate.    Please refer to the [facility] website for the visitor guidelines for Inpatients (after your surgery is over and you are in a regular room).       Your procedure is scheduled on:  11/29/23    Report to [facility] Main Entrance    Report to admitting at  0900AM   Call this number if you have problems the morning of surgery [phone]   Do not eat food  or dirnk liquids :After Midnight.                 If you have questions, please contact your surgeon's office.       Oral Hygiene is also important to reduce your risk of infection.                                    Remember - BRUSH YOUR TEETH THE MORNING OF SURGERY WITH YOUR REGULAR TOOTHPASTE  DENTURES WILL BE REMOVED PRIOR TO SURGERY PLEASE DO NOT APPLY "Poly grip" OR ADHESIVES!!!   Do NOT smoke after Midnight   Stop all vitamins and herbal supplements 7 days before surgery.   Take these medicines the morning of surgery with A SIP OF WATER: none             Metformin- none am of surgery   DO NOT TAKE ANY ORAL DIABETIC MEDICATIONS DAY OF YOUR SURGERY  Bring CPAP mask and tubing day of surgery.                              You may not have any metal on  your body including hair pins, jewelry, and body piercing             Do not wear make-up, lotions, powders, perfumes/cologne, or deodorant  Do not wear nail polish including gel and S&S, artificial/acrylic nails, or any other type of covering on natural nails including finger and toenails. If you have artificial nails, gel coating, etc. that needs to be removed by a nail salon please have this removed prior to surgery or surgery may need to be canceled/ delayed if the surgeon/ anesthesia feels like they are unable to be safely monitored.   Do not shave  48 hours prior to surgery.               Men may shave face and neck.   Do not bring valuables to the hospital. [facility] IS NOT             RESPONSIBLE   FOR VALUABLES.   Contacts, glasses, dentures  or bridgework may not be worn into surgery.   Bring small overnight bag day of surgery.   DO NOT BRING YOUR HOME MEDICATIONS TO THE HOSPITAL. PHARMACY WILL DISPENSE MEDICATIONS LISTED ON YOUR MEDICATION LIST TO YOU DURING YOUR ADMISSION IN THE HOSPITAL!    Patients discharged on the day of surgery will not be allowed to drive home.  Someone NEEDS to stay with you for the first 24 hours after anesthesia.   Special Instructions: Bring a copy of your healthcare power of attorney and living will documents the day of surgery if you haven't scanned them before.              Please read over the following fact sheets you were given: IF YOU HAVE QUESTIONS ABOUT YOUR PRE-OP INSTRUCTIONS PLEASE CALL [phone]   If you received a COVID test during your pre-op visit  it is requested that you wear a mask when out in public, stay away from anyone that may not be feeling well and notify your surgeon if you develop symptoms. If you test positive for Covid or have been in contact with anyone that has tested positive in the last 10 days please notify you surgeon.    [facility] - Preparing for Surgery Before surgery, you can play an important role.   Because skin is not sterile, your skin needs to be as free of germs as possible.  You can reduce the number of germs on your skin by washing with CHG (chlorahexidine gluconate) soap before surgery.  CHG is an antiseptic cleaner which kills germs and bonds with the skin to continue killing germs even after washing. Please DO NOT use if you have an allergy to CHG or antibacterial soaps.  If your skin becomes reddened/irritated stop using the CHG and inform your nurse when you arrive at Short Stay. Do not shave (including legs and underarms) for at least 48 hours prior to the first CHG shower.  You may shave your face/neck. Please follow these instructions carefully:  1.  Shower with CHG Soap the night before surgery and the  morning of Surgery.  2.  If you choose to wash your hair, wash your hair first as usual with your  normal  shampoo.  3.  After you shampoo, rinse your hair and body thoroughly to remove the  shampoo.                           4.  Use CHG as you would any other liquid soap.  You can apply chg directly  to the skin and wash                       Gently with a scrungie or clean washcloth.  5.  Apply the CHG Soap to your body ONLY FROM THE NECK DOWN.   Do not use on face/ open                           Wound or open sores. Avoid contact with eyes, ears mouth and genitals (private parts).                       Wash face,  Genitals (private parts) with your normal soap.             6.  Wash thoroughly, paying special attention to the area where your surgery  will be performed.  7.  Thoroughly rinse your body with warm water from the neck down.  8.  DO NOT shower/wash with your normal soap after using and rinsing off  the CHG Soap.                9.  Pat yourself dry with a clean towel.            10.  Wear clean pajamas.            11.  Place clean sheets on your bed the night of your first shower and do not  sleep with pets. Day of Surgery : Do not apply any lotions/deodorants the  morning of surgery.  Please wear clean clothes to the hospital/surgery center.  FAILURE TO FOLLOW THESE INSTRUCTIONS MAY RESULT IN THE CANCELLATION OF YOUR SURGERY PATIENT SIGNATURE_________________________________  NURSE SIGNATURE__________________________________  ________________________________________________________________________

## 2023-11-15 ENCOUNTER — Encounter (HOSPITAL_COMMUNITY)
Admission: RE | Admit: 2023-11-15 | Discharge: 2023-11-15 | Disposition: A | Discharge status: Home / Self Care | Source: Ambulatory Visit | Attending: Urology | Admitting: Urology

## 2023-11-15 ENCOUNTER — Encounter (HOSPITAL_COMMUNITY): Payer: Self-pay

## 2023-11-15 ENCOUNTER — Other Ambulatory Visit: Payer: Self-pay

## 2023-11-15 VITALS — BP 129/92 | HR 89 | Temp 98.0°F | Resp 16 | Ht 66.0 in | Wt 230.0 lb

## 2023-11-15 DIAGNOSIS — Z01812 Encounter for preprocedural laboratory examination: Secondary | ICD-10-CM | POA: Diagnosis not present

## 2023-11-15 DIAGNOSIS — Z01818 Encounter for other preprocedural examination: Secondary | ICD-10-CM

## 2023-11-15 DIAGNOSIS — R7303 Prediabetes: Secondary | ICD-10-CM | POA: Diagnosis not present

## 2023-11-15 HISTORY — DX: Unspecified osteoarthritis, unspecified site: M19.90

## 2023-11-15 HISTORY — DX: Dyspnea, unspecified: R06.00

## 2023-11-15 LAB — BASIC METABOLIC PANEL WITH GFR
Anion gap: 7 (ref 5–15)
BUN: 19 mg/dL (ref 8–23)
CO2: 28 mmol/L (ref 22–32)
Calcium: 9.7 mg/dL (ref 8.9–10.3)
Chloride: 97 mmol/L — ABNORMAL LOW (ref 98–111)
Creatinine, Ser: 0.9 mg/dL (ref 0.61–1.24)
GFR, Estimated: >60 mL/min (ref >60)
Glucose, Bld: 112 mg/dL — ABNORMAL HIGH (ref 70–99)
Potassium: 3.5 mmol/L (ref 3.5–5.1)
Sodium: 132 mmol/L — ABNORMAL LOW (ref 135–145)

## 2023-11-15 LAB — HEMOGLOBIN A1C
Hgb A1c MFr Bld: 6.5 % — ABNORMAL HIGH (ref 4.8–5.6)
Mean Plasma Glucose: 140 mg/dL

## 2023-11-15 LAB — CBC
HCT: 39 % (ref 39.0–52.0)
Hemoglobin: 13.1 g/dL (ref 13.0–17.0)
MCH: 30.7 pg (ref 26.0–34.0)
MCHC: 33.6 g/dL (ref 30.0–36.0)
MCV: 91.3 fL (ref 80.0–100.0)
Platelets: 320 10*3/uL (ref 150–400)
RBC: 4.27 MIL/uL (ref 4.22–5.81)
RDW: 12.6 % (ref 11.5–15.5)
WBC: 3.6 10*3/uL — ABNORMAL LOW (ref 4.0–10.5)
nRBC: 0 % (ref 0.0–0.2)

## 2023-11-15 LAB — GLUCOSE, CAPILLARY: Glucose-Capillary: 122 mg/dL — ABNORMAL HIGH (ref 70–99)

## 2023-11-16 LAB — URINE CULTURE: Culture: NO GROWTH

## 2023-11-28 MED ORDER — GENTAMICIN SULFATE 40 MG/ML IJ SOLN
5.0000 mg/kg | INTRAVENOUS | Status: AC
  Administered 2023-11-29: 400 mg via INTRAVENOUS
  Filled 2023-11-28: qty 10

## 2023-11-29 ENCOUNTER — Ambulatory Visit (HOSPITAL_COMMUNITY): Payer: Self-pay | Admitting: Physician Assistant

## 2023-11-29 ENCOUNTER — Other Ambulatory Visit: Payer: Self-pay

## 2023-11-29 ENCOUNTER — Encounter (HOSPITAL_COMMUNITY)
Admission: RE | Disposition: A | Discharge status: Home / Self Care | Payer: Self-pay | Source: Home / Self Care | Attending: Urology

## 2023-11-29 ENCOUNTER — Observation Stay (HOSPITAL_COMMUNITY)
Admission: RE | Admit: 2023-11-29 | Discharge: 2023-11-30 | Disposition: A | Discharge status: Home / Self Care | Attending: Urology | Admitting: Urology

## 2023-11-29 ENCOUNTER — Ambulatory Visit (HOSPITAL_BASED_OUTPATIENT_CLINIC_OR_DEPARTMENT_OTHER): Payer: Self-pay | Admitting: Anesthesiology

## 2023-11-29 ENCOUNTER — Encounter (HOSPITAL_COMMUNITY): Payer: Self-pay | Admitting: Urology

## 2023-11-29 DIAGNOSIS — N393 Stress incontinence (female) (male): Principal | ICD-10-CM | POA: Insufficient documentation

## 2023-11-29 DIAGNOSIS — E119 Type 2 diabetes mellitus without complications: Secondary | ICD-10-CM

## 2023-11-29 DIAGNOSIS — Z8546 Personal history of malignant neoplasm of prostate: Secondary | ICD-10-CM | POA: Insufficient documentation

## 2023-11-29 DIAGNOSIS — Z79899 Other long term (current) drug therapy: Secondary | ICD-10-CM | POA: Diagnosis not present

## 2023-11-29 DIAGNOSIS — Z7984 Long term (current) use of oral hypoglycemic drugs: Secondary | ICD-10-CM | POA: Insufficient documentation

## 2023-11-29 DIAGNOSIS — Z01818 Encounter for other preprocedural examination: Secondary | ICD-10-CM

## 2023-11-29 DIAGNOSIS — I10 Essential (primary) hypertension: Secondary | ICD-10-CM | POA: Diagnosis not present

## 2023-11-29 DIAGNOSIS — Z960 Presence of urogenital implants: Principal | ICD-10-CM

## 2023-11-29 HISTORY — PX: URINARY SPHINCTER IMPLANT: SHX2624

## 2023-11-29 LAB — GLUCOSE, CAPILLARY
Glucose-Capillary: 122 mg/dL — ABNORMAL HIGH (ref 70–99)
Glucose-Capillary: 114 mg/dL — ABNORMAL HIGH (ref 70–99)
Glucose-Capillary: 124 mg/dL — ABNORMAL HIGH (ref 70–99)
Glucose-Capillary: 180 mg/dL — ABNORMAL HIGH (ref 70–99)

## 2023-11-29 SURGERY — INSERTION, ARTIFICIAL URINARY SPHINCTER
Anesthesia: General

## 2023-11-29 MED ORDER — ONDANSETRON HCL 4 MG/2ML IJ SOLN
INTRAMUSCULAR | Status: DC | PRN
  Administered 2023-11-29: 4 mg via INTRAVENOUS

## 2023-11-29 MED ORDER — FLUCONAZOLE IN SODIUM CHLORIDE 200-0.9 MG/100ML-% IV SOLN
200.0000 mg | Freq: Once | INTRAVENOUS | Status: AC
  Administered 2023-11-29: 200 mg via INTRAVENOUS
  Filled 2023-11-29: qty 100

## 2023-11-29 MED ORDER — CHLORTHALIDONE 25 MG PO TABS
25.0000 mg | ORAL_TABLET | Freq: Every day | ORAL | Status: DC
  Administered 2023-11-30: 25 mg via ORAL
  Filled 2023-11-29: qty 1

## 2023-11-29 MED ORDER — MIDAZOLAM HCL 5 MG/5ML IJ SOLN
INTRAMUSCULAR | Status: DC | PRN
  Administered 2023-11-29: 2 mg via INTRAVENOUS

## 2023-11-29 MED ORDER — LIDOCAINE HCL (CARDIAC) PF 100 MG/5ML IV SOSY
PREFILLED_SYRINGE | INTRAVENOUS | Status: DC | PRN
  Administered 2023-11-29: 20 mg via INTRAVENOUS

## 2023-11-29 MED ORDER — FENTANYL CITRATE PF 50 MCG/ML IJ SOSY
25.0000 ug | PREFILLED_SYRINGE | INTRAMUSCULAR | Status: DC | PRN
  Administered 2023-11-29: 50 ug via INTRAVENOUS

## 2023-11-29 MED ORDER — BUPIVACAINE HCL (PF) 0.5 % IJ SOLN
INTRAMUSCULAR | Status: DC | PRN
  Administered 2023-11-29: 10 mL

## 2023-11-29 MED ORDER — DOCUSATE SODIUM 100 MG PO CAPS
100.0000 mg | ORAL_CAPSULE | Freq: Two times a day (BID) | ORAL | Status: DC
  Administered 2023-11-29 – 2023-11-30 (×2): 100 mg via ORAL
  Filled 2023-11-29 (×2): qty 1

## 2023-11-29 MED ORDER — MIDAZOLAM HCL 2 MG/2ML IJ SOLN
INTRAMUSCULAR | Status: AC
  Filled 2023-11-29: qty 2

## 2023-11-29 MED ORDER — OXYCODONE HCL 5 MG PO TABS: 5.0000 mg | ORAL_TABLET | ORAL | Status: DC | PRN

## 2023-11-29 MED ORDER — FENTANYL CITRATE (PF) 100 MCG/2ML IJ SOLN
INTRAMUSCULAR | Status: DC | PRN
  Administered 2023-11-29: 100 ug via INTRAVENOUS

## 2023-11-29 MED ORDER — LACTATED RINGERS IV SOLN: INTRAVENOUS | Status: AC

## 2023-11-29 MED ORDER — PROPOFOL 10 MG/ML IV BOLUS
INTRAVENOUS | Status: AC
  Filled 2023-11-29: qty 20

## 2023-11-29 MED ORDER — ORAL CARE MOUTH RINSE: 15.0000 mL | Freq: Once | OROMUCOSAL | Status: AC

## 2023-11-29 MED ORDER — CELECOXIB 200 MG PO CAPS: 200.0000 mg | ORAL_CAPSULE | Freq: Two times a day (BID) | ORAL | 1 refills | Status: AC

## 2023-11-29 MED ORDER — LIDOCAINE HCL 1 % IJ SOLN
INTRAMUSCULAR | Status: AC
  Filled 2023-11-29: qty 20

## 2023-11-29 MED ORDER — DEXAMETHASONE SODIUM PHOSPHATE 10 MG/ML IJ SOLN
INTRAMUSCULAR | Status: DC | PRN
Start: 2023-11-29 — End: 2023-11-29
  Administered 2023-11-29: 4 mg via INTRAVENOUS

## 2023-11-29 MED ORDER — FENTANYL CITRATE (PF) 100 MCG/2ML IJ SOLN
INTRAMUSCULAR | Status: AC
  Filled 2023-11-29: qty 2

## 2023-11-29 MED ORDER — CHLORHEXIDINE GLUCONATE 4 % EX SOLN: Freq: Once | CUTANEOUS | Status: DC

## 2023-11-29 MED ORDER — ACETAMINOPHEN 500 MG PO TABS
1000.0000 mg | ORAL_TABLET | Freq: Once | ORAL | Status: AC
  Administered 2023-11-29: 1000 mg via ORAL
  Filled 2023-11-29: qty 2

## 2023-11-29 MED ORDER — HYDROMORPHONE HCL 1 MG/ML IJ SOLN: 0.5000 mg | INTRAMUSCULAR | Status: DC | PRN

## 2023-11-29 MED ORDER — PROPOFOL 10 MG/ML IV BOLUS
INTRAVENOUS | Status: DC | PRN
  Administered 2023-11-29: 130 mg via INTRAVENOUS

## 2023-11-29 MED ORDER — SENNA 8.6 MG PO TABS
1.0000 | ORAL_TABLET | Freq: Two times a day (BID) | ORAL | Status: DC
  Administered 2023-11-29 – 2023-11-30 (×2): 8.6 mg via ORAL
  Filled 2023-11-29 (×2): qty 1

## 2023-11-29 MED ORDER — ACETAMINOPHEN 500 MG PO TABS
1000.0000 mg | ORAL_TABLET | Freq: Four times a day (QID) | ORAL | Status: DC
  Administered 2023-11-29 – 2023-11-30 (×4): 1000 mg via ORAL
  Filled 2023-11-29 (×4): qty 2

## 2023-11-29 MED ORDER — OXYCODONE HCL 5 MG PO TABS: 5.0000 mg | ORAL_TABLET | Freq: Four times a day (QID) | ORAL | 0 refills | Status: DC | PRN

## 2023-11-29 MED ORDER — LOSARTAN POTASSIUM 50 MG PO TABS
100.0000 mg | ORAL_TABLET | Freq: Every day | ORAL | Status: DC
  Administered 2023-11-30: 100 mg via ORAL
  Filled 2023-11-29: qty 2

## 2023-11-29 MED ORDER — VANCOMYCIN HCL 1500 MG/300ML IV SOLN
1500.0000 mg | INTRAVENOUS | Status: AC
  Administered 2023-11-29: 1500 mg via INTRAVENOUS
  Filled 2023-11-29: qty 300

## 2023-11-29 MED ORDER — BUPIVACAINE HCL (PF) 0.5 % IJ SOLN
INTRAMUSCULAR | Status: AC
  Filled 2023-11-29: qty 30

## 2023-11-29 MED ORDER — PHENYLEPHRINE 80 MCG/ML (10ML) SYRINGE FOR IV PUSH (FOR BLOOD PRESSURE SUPPORT)
PREFILLED_SYRINGE | INTRAVENOUS | Status: DC | PRN
  Administered 2023-11-29: 160 ug via INTRAVENOUS
  Administered 2023-11-29: 80 ug via INTRAVENOUS

## 2023-11-29 MED ORDER — INSULIN ASPART 100 UNIT/ML IJ SOLN: 0.0000 [IU] | INTRAMUSCULAR | Status: DC | PRN

## 2023-11-29 MED ORDER — LIDOCAINE HCL 1 % IJ SOLN
INTRAMUSCULAR | Status: DC | PRN
  Administered 2023-11-29: 10 mL

## 2023-11-29 MED ORDER — SULFAMETHOXAZOLE-TRIMETHOPRIM 800-160 MG PO TABS: 1.0000 | ORAL_TABLET | Freq: Two times a day (BID) | ORAL | 0 refills | Status: DC

## 2023-11-29 MED ORDER — KETOROLAC TROMETHAMINE 15 MG/ML IJ SOLN
15.0000 mg | Freq: Four times a day (QID) | INTRAMUSCULAR | Status: DC
  Administered 2023-11-29 – 2023-11-30 (×3): 15 mg via INTRAVENOUS
  Filled 2023-11-29 (×3): qty 1

## 2023-11-29 MED ORDER — VANCOMYCIN HCL IN DEXTROSE 1-5 GM/200ML-% IV SOLN
1000.0000 mg | Freq: Two times a day (BID) | INTRAVENOUS | Status: AC
  Administered 2023-11-29: 1000 mg via INTRAVENOUS
  Filled 2023-11-29: qty 200

## 2023-11-29 MED ORDER — ROCURONIUM BROMIDE 100 MG/10ML IV SOLN
INTRAVENOUS | Status: DC | PRN
  Administered 2023-11-29: 60 mg via INTRAVENOUS

## 2023-11-29 MED ORDER — SUGAMMADEX SODIUM 200 MG/2ML IV SOLN
INTRAVENOUS | Status: DC | PRN
  Administered 2023-11-29: 208.6 mg via INTRAVENOUS

## 2023-11-29 MED ORDER — CHLORHEXIDINE GLUCONATE 0.12 % MT SOLN
15.0000 mL | Freq: Once | OROMUCOSAL | Status: AC
  Administered 2023-11-29: 15 mL via OROMUCOSAL

## 2023-11-29 MED ORDER — INSULIN ASPART 100 UNIT/ML IJ SOLN: 6.0000 [IU] | Freq: Three times a day (TID) | INTRAMUSCULAR | Status: DC

## 2023-11-29 MED ORDER — MUPIROCIN 2 % EX OINT
1.0000 | TOPICAL_OINTMENT | Freq: Once | CUTANEOUS | Status: DC
  Filled 2023-11-29: qty 22

## 2023-11-29 MED ORDER — STERILE WATER FOR IRRIGATION IR SOLN
Status: DC | PRN
  Administered 2023-11-29: 3000 mL

## 2023-11-29 MED ORDER — ONDANSETRON HCL 4 MG/2ML IJ SOLN: 4.0000 mg | INTRAMUSCULAR | Status: DC | PRN

## 2023-11-29 MED ORDER — ONDANSETRON HCL 4 MG/2ML IJ SOLN: 4.0000 mg | Freq: Once | INTRAMUSCULAR | Status: DC | PRN

## 2023-11-29 MED ORDER — 0.9 % SODIUM CHLORIDE (POUR BTL) OPTIME
TOPICAL | Status: DC | PRN
  Administered 2023-11-29: 1000 mL

## 2023-11-29 MED ORDER — INSULIN ASPART 100 UNIT/ML IJ SOLN: 0.0000 [IU] | Freq: Three times a day (TID) | INTRAMUSCULAR | Status: DC

## 2023-11-29 MED ORDER — ACETAMINOPHEN 500 MG PO TABS: 1000.0000 mg | ORAL_TABLET | Freq: Four times a day (QID) | ORAL | 0 refills | Status: AC

## 2023-11-29 MED ORDER — HEMOSTATIC AGENTS (NO CHARGE) OPTIME
TOPICAL | Status: DC | PRN
  Administered 2023-11-29: 1 via TOPICAL

## 2023-11-29 MED ORDER — FENTANYL CITRATE PF 50 MCG/ML IJ SOSY
PREFILLED_SYRINGE | INTRAMUSCULAR | Status: AC
  Filled 2023-11-29: qty 2

## 2023-11-29 MED ORDER — SODIUM CHLORIDE 0.45 % IV SOLN
INTRAVENOUS | Status: DC
Start: 2023-11-29 — End: 2023-11-30

## 2023-11-29 SURGICAL SUPPLY — 60 items
BAG URINE DRAIN 2000ML AR STRL (UROLOGICAL SUPPLIES) IMPLANT
BALLOON PRESSURE REGUL 61 70CM (Miscellaneous) IMPLANT
BLADE SURG 15 STRL LF DISP TIS (BLADE) ×2 IMPLANT
BNDG GAUZE DERMACEA FLUFF 4 (GAUZE/BANDAGES/DRESSINGS) ×1 IMPLANT
BRIEF MESH DISP LRG (UNDERPADS AND DIAPERS) ×1 IMPLANT
CATH COUDE 5CC RIBBED (CATHETERS) IMPLANT
CATH FOLEY 2WAY SLVR 5CC 14FR (CATHETERS) ×1 IMPLANT
CHLORAPREP W/TINT 26 (MISCELLANEOUS) ×2 IMPLANT
CONTROL PUMP (Urological Implant) IMPLANT
COVER BACK TABLE 60X90IN (DRAPES) ×1 IMPLANT
COVER MAYO STAND STRL (DRAPES) ×1 IMPLANT
COVER SURGICAL LIGHT HANDLE (MISCELLANEOUS) ×1 IMPLANT
CUFF URINARY OCCL 4. IZ (Miscellaneous) IMPLANT
DERMABOND ADVANCED .7 DNX12 (GAUZE/BANDAGES/DRESSINGS) ×2 IMPLANT
DISSECTOR ROUND CHERRY 3/8 STR (MISCELLANEOUS) IMPLANT
DRAIN CHANNEL 10F 3/8 F FF (DRAIN) IMPLANT
DRAPE SURG IRRIG POUCH 19X23 (DRAPES) ×1 IMPLANT
DRSG TEGADERM 4X4.75 (GAUZE/BANDAGES/DRESSINGS) IMPLANT
ELECT REM PT RETURN 15FT ADLT (MISCELLANEOUS) ×1 IMPLANT
EVACUATOR SILICONE 100CC (DRAIN) IMPLANT
GAUZE 4X4 16PLY ~~LOC~~+RFID DBL (SPONGE) ×2 IMPLANT
GLOVE BIO SURGEON STRL SZ 6.5 (GLOVE) IMPLANT
GLOVE BIO SURGEON STRL SZ7 (GLOVE) ×1 IMPLANT
GLOVE BIOGEL PI IND STRL 7.0 (GLOVE) ×1 IMPLANT
GOWN SRG XL LVL 4 BRTHBL STRL (GOWNS) IMPLANT
GOWN STRL REUS W/ TWL XL LVL3 (GOWN DISPOSABLE) ×1 IMPLANT
HEMOSTAT ARISTA ABSORB 3G PWDR (HEMOSTASIS) IMPLANT
KIT ACCESSORY AMS 800 IMPLANT
KIT BASIN OR (CUSTOM PROCEDURE TRAY) ×1 IMPLANT
KIT TURNOVER KIT A (KITS) IMPLANT
LOOP VESSEL MAXI BLUE (MISCELLANEOUS) ×1 IMPLANT
NDL HYPO 21X1.5 SAFETY (NEEDLE) ×1 IMPLANT
NEEDLE HYPO 21X1.5 SAFETY (NEEDLE) ×1 IMPLANT
PENCIL SMOKE EVACUATOR (MISCELLANEOUS) ×1 IMPLANT
PLUG CATH AND CAP STRL 200 (CATHETERS) ×1 IMPLANT
PROTECTOR NERVE ULNAR (MISCELLANEOUS) ×1 IMPLANT
RETRACTOR DEEP SCROTAL PENILE (MISCELLANEOUS) IMPLANT
SET IRRIG Y TYPE TUR BLADDER L (SET/KITS/TRAYS/PACK) ×1 IMPLANT
SHEET LAVH (DRAPES) ×1 IMPLANT
SOLUTION PRONTOSAN WOUND 350ML (IRRIGATION / IRRIGATOR) ×1 IMPLANT
SPIKE FLUID TRANSFER (MISCELLANEOUS) ×3 IMPLANT
STAPLER SKIN PROX 35W (STAPLE) ×1 IMPLANT
SURGILUBE 2OZ TUBE FLIPTOP (MISCELLANEOUS) ×1 IMPLANT
SUT ETHILON 3 0 PS 1 (SUTURE) IMPLANT
SUT MNCRL AB 4-0 PS2 18 (SUTURE) ×2 IMPLANT
SUT SILK 0 FSL (SUTURE) IMPLANT
SUT VIC AB 0 CT1 27XBRD ANTBC (SUTURE) IMPLANT
SUT VIC AB 2-0 CT1 TAPERPNT 27 (SUTURE) IMPLANT
SUT VIC AB 2-0 SH 27X BRD (SUTURE) IMPLANT
SUT VIC AB 3-0 SH 27X BRD (SUTURE) ×4 IMPLANT
SUT VIC AB 3-0 SH 27XBRD (SUTURE) IMPLANT
SYR 10ML LL (SYRINGE) ×2 IMPLANT
SYR 20ML LL LF (SYRINGE) ×1 IMPLANT
SYR 30ML LL (SYRINGE) ×1 IMPLANT
SYR BULB IRRIG 60ML STRL (SYRINGE) ×1 IMPLANT
SYR CONTROL 10ML LL (SYRINGE) ×1 IMPLANT
TOWEL GREEN STERILE FF (TOWEL DISPOSABLE) ×1 IMPLANT
TOWEL OR 17X26 10 PK STRL BLUE (TOWEL DISPOSABLE) ×2 IMPLANT
TUBING CONNECTING 10 (TUBING) ×1 IMPLANT
YANKAUER SUCT BULB TIP 10FT TU (MISCELLANEOUS) ×1 IMPLANT

## 2023-11-29 NOTE — Anesthesia Preprocedure Evaluation (Addendum)
 Anesthesia Evaluation  Patient identified by MRN, date of birth, ID band Patient awake    Reviewed: Allergy & Precautions, NPO status , Patient's Chart, lab work & pertinent test results  Airway Mallampati: III  TM Distance: >3 FB Neck ROM: Full    Dental  (+) Dental Advisory Given, Partial Upper, Missing   Pulmonary neg pulmonary ROS   Pulmonary exam normal breath sounds clear to auscultation       Cardiovascular hypertension, Pt. on medications (-) angina (-) Past MI Normal cardiovascular exam Rhythm:Regular Rate:Normal     Neuro/Psych negative neurological ROS     GI/Hepatic negative GI ROS,,,(+)     substance abuse  alcohol use, Hepatitis -, C  Endo/Other  diabetes, Type 2, Oral Hypoglycemic Agents  Obesity   Renal/GU negative Renal ROS Bladder dysfunction  H/o prostate cancer     Musculoskeletal  (+) Arthritis ,    Abdominal   Peds  Hematology negative hematology ROS (+)   Anesthesia Other Findings Day of surgery medications reviewed with the patient.  Reproductive/Obstetrics                             Anesthesia Physical Anesthesia Plan  ASA: 3  Anesthesia Plan: General   Post-op Pain Management: Tylenol  PO (pre-op)*   Induction: Intravenous  PONV Risk Score and Plan: 3 and Midazolam , Dexamethasone  and Ondansetron   Airway Management Planned: Oral ETT  Additional Equipment:   Intra-op Plan:   Post-operative Plan: Extubation in OR  Informed Consent: I have reviewed the patients History and Physical, chart, labs and discussed the procedure including the risks, benefits and alternatives for the proposed anesthesia with the patient or authorized representative who has indicated his/her understanding and acceptance.     Dental advisory given  Plan Discussed with: CRNA  Anesthesia Plan Comments:         Anesthesia Quick Evaluation

## 2023-11-29 NOTE — Anesthesia Procedure Notes (Signed)
 Procedure Name: Intubation Date/Time: 11/29/2023 11:50 AM  Performed by: Andee Bamberger, CRNAPre-anesthesia Checklist: Patient identified, Emergency Drugs available, Suction available and Patient being monitored Patient Re-evaluated:Patient Re-evaluated prior to induction Oxygen Delivery Method: Circle system utilized Preoxygenation: Pre-oxygenation with 100% oxygen Induction Type: IV induction Ventilation: Mask ventilation without difficulty Laryngoscope Size: Mac and 4 Grade View: Grade I Tube type: Oral Tube size: 7.0 mm Number of attempts: 1 Airway Equipment and Method: Stylet and Oral airway Placement Confirmation: ETT inserted through vocal cords under direct vision, positive ETCO2 and breath sounds checked- equal and bilateral Secured at: 22 cm Tube secured with: Tape Dental Injury: Teeth and Oropharynx as per pre-operative assessment

## 2023-11-29 NOTE — Discharge Instructions (Signed)
Artificial Urinary Sphincter Post Operative Instructions Dr. Luke Zakiya Sporrer  You may notice some swelling or black and blue bruising. This is very common, and may increase slightly over the next several days. Typically it will begin to improve 1-2 weeks after surgery You may take a shower 48 hours after surgery. Avoid submerging yourself completely in water (bath tubs, hot tubs, swimming pools, etc) until 1 month after the surgery. To clean the incision, let soapy water gently wash over the area and pat lightly. Use supportive, tight-fitting underwear for the first two weeks after surgery. For example, jock straps, sliding shorts, or briefs Apply ice packs 20 minutes on/20 minutes off for at least the first 2 days following surgery. This will help  minimize swelling and discomfort. After the first few days you can continue using ice packs if they are helpful  The skin incisions are closed with sutures and purple skin glue. Both of these things dissolve on their own over the course of several weeks.   Avoid lifting anything heavier than 15 pounds for 1 month Do not put any direct pressure on the incision for long periods of time for the first 6 weeks following surgery. For example, do not ride a bicycle, motorcycle, ATV, or horse.  Sitting on a soft cushion, "donut" cushion, or pillow can help with the discomfort Avoid all sexual contact for 2 weeks following the surgery. You will be prescribed an antibiotic following the surgery; please take this as prescribed.  For pain post-operatively, you will typically be prescribed 2 medicines. The first is an anti-inflammatory medication (Celebrex, Toradol, Meloxicam). The second is narcotic pain medication (Tramadol, Oxycodone). You can take these as needed, and can supplement with over the counter tylenol. I recommend limiting the amount of narcotic pain medication you take as these medications can cause constipation and in rare cases, addiction.  It's important  to remember that your sphincter will not be activated until your 6 week follow up appointment. This means you will likely still leak urine immediately after the surgery. Please continue using pads or depends as needed  

## 2023-11-29 NOTE — Op Note (Signed)
 Preoperative diagnosis:  1. Stress urinary incontinence 2. History of prostate cancer s/p prostatectomy  Postoperative diagnosis: same  Procedure(s): 1. Insertion of Artificial Urinary Sphincter (AMS 800) 2. Flexible cystoscopy  Surgeon: Dr. Julene Oaks  Assistant: Dr Alphonza Ashing  Anesthesia: General  Complications: none  EBL: 50 cc  Intraoperative findings: 4.0 cuff size, PRB in RLQ, good urethral coaptation with appropriately sized cuff  Indication: William Duffy is a 68 y.o.yo M who with stress incontinence s/p prostatectomy. After an extensive discussion, he is interested in proceeding with an AUS  Description of procedure:  After appropriate consent had been obtained, the patient was brought to the operative suite where anesthesia was induced. The patient was prepped and draped in the usual sterile fashion. Extra care was taken with leg positioning to minimize the risk of compartment syndrome neuropathy and deep vein thrombosis.  Preoperative antibiotics were given. A timeout was done, then an 14 Fr foley was placed.   I an made approximately 5 cm incision in the perineum involving the lower aspect of the scrotum.  I dissected down through soft tissue and mobilized the subcutaneous tissue from the bulbospongiosus midline. The lonestar retractor was placed and used throughout for exposure. I then split the muscle in the midline, exposing the corpus spongiosum - of note, the patient's muscle was atrophic.  We carefully dissected the sponge free from the underlying corpus cavernosum. Ultimately it was dissected free circumferentially and a vessel loop was passed through to aid in further dissection. The dissection was continued until just over 1 cm of space was created behind the urethra. The sized was passed and used to measure. It showed 4.0 cm, and the corresponding cuff was selected. The cuff was then passed and secured in place.  We then directed our attention to the right  lower quadrant. A small incision was made, and we carefully dissected down through the layers of the abdomen. The  fascia was encountered and opened. Blunt dissected was used to create a space for the PRB. It was inserted into this space, and the fascia was closed with 2-0 vicryl. The balloon was inflated with 24 cc.   The pump was then carefully passed down into a dependent position in the scrotum  on the right. It was held in place with a babcock. The tubing was pexed with a 3-0 vicryl.   The tubing on the cuff was then passed into the RLQ incision. Excess tubing was excised, and the components were connected.   We then tested the pump and it seemed to be working properly. It was deactivated with good dimple confirmed at the end of the case.   The RLQ incision was carefully closed in layers, first using 3-0 vicryl, then 4-0 monocryl.  The perineum was then closed.  I was cognizant of the scrotal aspect of the closure with 3-0 Vicryl that also included reapproximation of the bulbospongiosus muscle - this layer was limited due to the atrophy.  The next layer was deeper subcutaneous suture with 3-0 Vicryl.  A third layer was close to the skin with level with 3-0 Vicryl.  4-0 running sutures used for the perineum.  Dermabond was applied with fluff dressing  The Foley catheter was draining well at the end of the case.  Leg position was good. I was very pleased with the surgery and hopefully it reaches the patient's treatment goal  Julene Oaks MD 11/29/2023, 1:45 PM  [facility]  Pager: [phone]

## 2023-11-29 NOTE — Transfer of Care (Signed)
 Immediate Anesthesia Transfer of Care Note  Patient: William Duffy  Procedure(s) Performed: INSERTION, ARTIFICIAL URINARY SPHINCTER  Patient Location: PACU  Anesthesia Type:General  Level of Consciousness: awake, alert , and oriented  Airway & Oxygen Therapy: Patient Spontanous Breathing  Post-op Assessment: Report given to RN and Post -op Vital signs reviewed and stable  Post vital signs: Reviewed and stable  Last Vitals:  Vitals Value Taken Time  BP 122/90 11/29/23 1354  Temp    Pulse 71 11/29/23 1357  Resp 18 11/29/23 1357  SpO2 95 % 11/29/23 1357  Vitals shown include unfiled device data.  Last Pain:  Vitals:   11/29/23 1014  TempSrc:   PainSc: 0-No pain      Patients Stated Pain Goal: 5 (11/29/23 1005)  Complications: No notable events documented.

## 2023-11-29 NOTE — Anesthesia Postprocedure Evaluation (Signed)
 Anesthesia Post Note  Patient: William Duffy  Procedure(s) Performed: INSERTION, ARTIFICIAL URINARY SPHINCTER     Patient location during evaluation: PACU Anesthesia Type: General Level of consciousness: patient cooperative, oriented and sedated Pain management: pain level controlled Vital Signs Assessment: post-procedure vital signs reviewed and stable Respiratory status: spontaneous breathing, nonlabored ventilation and respiratory function stable Cardiovascular status: blood pressure returned to baseline and stable Postop Assessment: no apparent nausea or vomiting Anesthetic complications: no   No notable events documented.  Last Vitals:  Vitals:   11/29/23 1500 11/29/23 1514  BP: (!) 140/93 (!) 142/79  Pulse: (!) 57 63  Resp: 14 20  Temp:  (!) 36.4 C  SpO2: 98% 97%    Last Pain:  Vitals:   11/29/23 1610  TempSrc:   PainSc: 4                  Diamante Rubin,E. Stephenia Vogan

## 2023-11-29 NOTE — H&P (Signed)
 H&P  History of Present Illness: William Duffy is a 68 y.o. year old M who presents today for insertion of an artificial urinary sphincter  No acute complaints  Past Medical History:  Diagnosis Date   Acute lateral meniscus tear of right knee    Alcohol abuse    Arthritis    Colon adenomas    Diet-controlled diabetes mellitus (HCC)    type 2   Diverticulosis    Dyspnea    H/O drug abuse (HCC)    H/O: gout    Hepatitis    hepatitis c   Hypercholesteremia    Hypertension    Internal hemorrhoids    Liver disease    Neutropenia (HCC)    Obesity    Prostate cancer [facility])     Past Surgical History:  Procedure Laterality Date   CATARACT EXTRACTION W/PHACO Left 03/29/2019   Procedure: CATARACT EXTRACTION PHACO AND INTRAOCULAR LENS PLACEMENT (IOC)  LEFT;  Surgeon: Ola Berger, MD;  Location: [facility] ORS;  Service: Ophthalmology;  Laterality: Left;  US  01:35.7 AP% 14.6 CDE 16.14 Fluid Pack Lot #[id number]   COLONOSCOPY WITH PROPOFOL  N/A 01/03/2015   Procedure: COLONOSCOPY WITH PROPOFOL ;  Surgeon: Deveron Fly, MD;  Location: [facility] ENDOSCOPY;  Service: Endoscopy;  Laterality: N/A;   COLONOSCOPY WITH PROPOFOL  N/A 05/06/2015   Procedure: COLONOSCOPY WITH PROPOFOL ;  Surgeon: Deveron Fly, MD;  Location: [facility] ENDOSCOPY;  Service: Endoscopy;  Laterality: N/A;   COLONOSCOPY WITH PROPOFOL  N/A 06/06/2017   Procedure: COLONOSCOPY WITH PROPOFOL ;  Surgeon: Deveron Fly, MD;  Location: [facility] ENDOSCOPY;  Service: Endoscopy;  Laterality: N/A;   COLONOSCOPY WITH PROPOFOL  N/A 03/18/2023   Procedure: COLONOSCOPY WITH PROPOFOL ;  Surgeon: Quintin Buckle, DO;  Location: [facility] ENDOSCOPY;  Service: Gastroenterology;  Laterality: N/A;   HEMOSTASIS CLIP PLACEMENT  03/18/2023   Procedure: HEMOSTASIS CLIP PLACEMENT;  Surgeon: Quintin Buckle, DO;  Location: [facility] ENDOSCOPY;  Service: Gastroenterology;;   KNEE ARTHROSCOPY Right    LIVER BIOPSY     MENISCUS REPAIR     PELVIC LYMPH NODE  DISSECTION Bilateral 09/14/2021   Procedure: PELVIC LYMPH NODE DISSECTION;  Surgeon: Lawerence Pressman, MD;  Location: [facility] ORS;  Service: Urology;  Laterality: Bilateral;   POLYPECTOMY  03/18/2023   Procedure: POLYPECTOMY;  Surgeon: Quintin Buckle, DO;  Location: [facility] ENDOSCOPY;  Service: Gastroenterology;;   PROSTATE BIOPSY N/A 07/21/2021   Procedure: PROSTATE BIOPSY;  Surgeon: Geraline Knapp, MD;  Location: [facility] ORS;  Service: Urology;  Laterality: N/A;   ROBOT ASSISTED LAPAROSCOPIC RADICAL PROSTATECTOMY N/A 09/14/2021   Procedure: XI ROBOTIC ASSISTED LAPAROSCOPIC RADICAL PROSTATECTOMY;  Surgeon: Lawerence Pressman, MD;  Location: [facility] ORS;  Service: Urology;  Laterality: N/A;   TRANSRECTAL ULTRASOUND N/A 07/21/2021   Procedure: TRANSRECTAL ULTRASOUND;  Surgeon: Geraline Knapp, MD;  Location: [facility] ORS;  Service: Urology;  Laterality: N/A;    Home Medications:  Current Meds  Medication Sig   chlorthalidone (HYGROTON) 25 MG tablet Take 25 mg by mouth daily.   indomethacin  (INDOCIN ) 50 MG capsule Take 50 mg by mouth 2 (two) times daily as needed (gout flare).    losartan  (COZAAR ) 100 MG tablet Take 100 mg by mouth daily.   lovastatin (MEVACOR) 40 MG tablet Take 40 mg by mouth at bedtime.   metFORMIN (GLUCOPHAGE-XR) 500 MG 24 hr tablet Take 500 mg by mouth daily.    Allergies:  Allergies  Allergen Reactions   Shrimp [Shellfish Allergy] Hives and Swelling    Throat swells  History reviewed. No pertinent family history.  Social History:  reports that he has never smoked. He has never been exposed to tobacco smoke. He has never used smokeless tobacco. He reports that he does not currently use alcohol. He reports that he does not currently use drugs after having used the following drugs: Cocaine.  ROS: A complete review of systems was performed.  All systems are negative except for pertinent findings as noted.  Physical Exam:  Vital signs in last 24 hours: Temp:  [98.5 F (36.9  C)] 98.5 F (36.9 C) (05/27 1005) Pulse Rate:  [91] 91 (05/27 1005) Resp:  [18] 18 (05/27 1005) BP: (129)/(97) 129/97 (05/27 1005) SpO2:  [95 %] 95 % (05/27 1005) Weight:  [104.3 kg] 104.3 kg (05/27 1005) Constitutional:  Alert and oriented, No acute distress Cardiovascular: Regular rate and rhythm Respiratory: Normal respiratory effort, Lungs clear bilaterally GI: Abdomen is soft, nontender, nondistended, no abdominal masses Lymphatic: No lymphadenopathy Neurologic: Grossly intact, no focal deficits Psychiatric: Normal mood and affect   Laboratory Data:  No results for input(s): "WBC", "HGB", "HCT", "PLT" in the last 72 hours.  No results for input(s): "NA", "K", "CL", "GLUCOSE", "BUN", "CALCIUM", "CREATININE" in the last 72 hours.  Invalid input(s): "CO3"   Results for orders placed or performed during the hospital encounter of 11/29/23 (from the past 24 hours)  Glucose, capillary     Status: Abnormal   Collection Time: 11/29/23 10:30 AM  Result Value Ref Range   Glucose-Capillary 122 (H) 70 - 99 mg/dL   No results found for this or any previous visit (from the past 240 hours).  Renal Function: No results for input(s): "CREATININE" in the last 168 hours. Estimated Creatinine Clearance: 88.9 mL/min (by C-G formula based on SCr of 0.9 mg/dL).  Radiologic Imaging: No results found.  Assessment:  William Duffy is a 68 y.o. year old M with SUI after prostatectomy  Plan:  To OR as planned for AUS. Procedure and risks reviewed, including but not limited to bleeding, infection, implant infection, implant malfunction, implant malplacement, erosion, damage to adjacent structures, pain, urinary retention, failure to improve or make dry. All questions answered   Julene Oaks, MD 11/29/2023, 10:58 AM  [facility] Pager: [phone]

## 2023-11-30 ENCOUNTER — Encounter (HOSPITAL_COMMUNITY): Payer: Self-pay | Admitting: Urology

## 2023-11-30 DIAGNOSIS — Z7984 Long term (current) use of oral hypoglycemic drugs: Secondary | ICD-10-CM | POA: Diagnosis not present

## 2023-11-30 DIAGNOSIS — Z79899 Other long term (current) drug therapy: Secondary | ICD-10-CM | POA: Diagnosis not present

## 2023-11-30 DIAGNOSIS — E119 Type 2 diabetes mellitus without complications: Secondary | ICD-10-CM | POA: Diagnosis not present

## 2023-11-30 DIAGNOSIS — N393 Stress incontinence (female) (male): Secondary | ICD-10-CM | POA: Diagnosis not present

## 2023-11-30 DIAGNOSIS — I10 Essential (primary) hypertension: Secondary | ICD-10-CM | POA: Diagnosis not present

## 2023-11-30 DIAGNOSIS — Z8546 Personal history of malignant neoplasm of prostate: Secondary | ICD-10-CM | POA: Diagnosis not present

## 2023-11-30 LAB — GLUCOSE, CAPILLARY: Glucose-Capillary: 133 mg/dL — ABNORMAL HIGH (ref 70–99)

## 2023-11-30 LAB — HEMOGLOBIN AND HEMATOCRIT, BLOOD
HCT: 32.4 % — ABNORMAL LOW (ref 39.0–52.0)
Hemoglobin: 11 g/dL — ABNORMAL LOW (ref 13.0–17.0)

## 2023-11-30 NOTE — TOC Transition Note (Signed)
 Transition of Care [facility]) - Discharge Note   Patient Details  Name: William Duffy MRN: [id number] Date of Birth: [birth year]  [facility]) CM/SW Contact:  Ruben Corolla, RN Phone Number: 11/30/2023, 9:39 AM   Clinical Narrative:d/c plan home.No CM needs.       Final next level of care: Home/Self Care Barriers to Discharge: No Barriers Identified   Patient Goals and CMS Choice Patient states their goals for this hospitalization and ongoing recovery are:: Home CMS Medicare.gov Compare Post Acute Care list provided to:: Patient Choice offered to / list presented to : Patient [place] ownership interest in [facility].provided to:: Patient    Discharge Placement                       Discharge Plan and Services Additional resources added to the After Visit Summary for     Discharge Planning Services: CM Consult                                 Social Drivers of Health (SDOH) Interventions SDOH Screenings   Food Insecurity: No Food Insecurity (11/29/2023)  Housing: Low Risk  (11/29/2023)  Transportation Needs: No Transportation Needs (11/29/2023)  Utilities: Not At Risk (11/29/2023)  Alcohol Screen: Low Risk  (03/26/2022)  Depression (PHQ2-9): Low Risk  (03/26/2022)  Financial Resource Strain: Medium Risk (12/13/2022)   Received from [facility]  Physical Activity: Sufficiently Active (03/26/2022)  Social Connections: Moderately Integrated (11/29/2023)  Stress: No Stress Concern Present (03/26/2022)  Tobacco Use: Low Risk  (11/29/2023)     Readmission Risk Interventions     No data to display

## 2023-11-30 NOTE — Progress Notes (Signed)
 AVS reviewed w/ pt and wife who verbalized an understanding- no other questions- pt to call urology for 6 week follow up appt- current one is at the end of August. PIV removed as noted. Pt dressing for d/c to home

## 2023-11-30 NOTE — Discharge Summary (Signed)
 Date of admission: 11/29/2023  Date of discharge: 11/30/2023  Admission diagnosis: stress urinary incontinence  Discharge diagnosis: same  Secondary diagnoses:  Patient Active Problem List   Diagnosis Date Noted   S/P insertion of penile implant 11/29/2023   Prostate cancer (HCC) 09/14/2021   Other neutropenia (HCC) 11/02/2016   Borderline diabetes mellitus 09/15/2015   History of hypertension 09/10/2015    Procedures performed: Procedure(s): INSERTION, ARTIFICIAL URINARY SPHINCTER  History and Physical: For full details, please see admission history and physical. Briefly, William Duffy is a 68 y.o. year old patient with a history of prior RALP for prostate cancer and subsequent stress urinary incontinence. He underwent artificial urinary sphincter placement on 11/29/23 with Dr. Jarvis Mesa.   Hospital Course: Patient tolerated the procedure well.  He was then transferred to the floor after an uneventful PACU stay.  His hospital course was uncomplicated.  On POD#1 he had met discharge criteria: was eating a regular diet, was up and ambulating independently,  pain was well controlled, was voiding without a catheter, and was ready to for discharge.   Laboratory values:  Recent Labs    11/30/23 0501  HGB 11.0*  HCT 32.4*   No results for input(s): "NA", "K", "CL", "CO2", "GLUCOSE", "BUN", "CREATININE", "CALCIUM" in the last 72 hours. No results for input(s): "LABPT", "INR" in the last 72 hours. No results for input(s): "LABURIN" in the last 72 hours. Results for orders placed or performed during the hospital encounter of 11/15/23  Urine Culture     Status: None   Collection Time: 11/15/23  9:42 AM   Specimen: Urine, Clean Catch  Result Value Ref Range Status   Specimen Description   Final    URINE, CLEAN CATCH Performed at [facility], [address] [place], [place] [address]    Special Requests   Final    NONE Performed at [facility],  [address], [place], [place] [address]    Culture   Final    NO GROWTH Performed at [facility], [address] [place], [place] [address]    Report Status 11/16/2023 FINAL  Final    Physical Exam  Gen: NAD Resp: Satting well on RA Card: Regular rate Abd: Soft, appropriately tender, ND, RLQ incision c/d/i GU: Voing spontaneously. Perineal incision c/d/i Neuro: Alert   Disposition: Home  Discharge instruction: The patient was instructed to be ambulatory but told to refrain from heavy lifting, strenuous activity, or driving. No lifting >10 pounds for 4 weeks.   Discharge medications:  Allergies as of 11/30/2023       Reactions   Shrimp [shellfish Allergy] Hives, Swelling   Throat swells        Medication List     TAKE these medications    acetaminophen  500 MG tablet Commonly known as: TYLENOL  Take 2 tablets (1,000 mg total) by mouth every 6 (six) hours.   celecoxib 200 MG capsule Commonly known as: CeleBREX Take 1 capsule (200 mg total) by mouth 2 (two) times daily for 28 days.   chlorthalidone 25 MG tablet Commonly known as: HYGROTON Take 25 mg by mouth daily.   indomethacin  50 MG capsule Commonly known as: INDOCIN  Take 50 mg by mouth 2 (two) times daily as needed (gout flare).   losartan  100 MG tablet Commonly known as: COZAAR  Take 100 mg by mouth daily.   lovastatin 40 MG tablet Commonly known as: MEVACOR Take 40 mg by mouth at bedtime.   metFORMIN 500  MG 24 hr tablet Commonly known as: GLUCOPHAGE-XR Take 500 mg by mouth daily.   oxyCODONE  5 MG immediate release tablet Commonly known as: Roxicodone  Take 1 tablet (5 mg total) by mouth every 6 (six) hours as needed.   sulfamethoxazole-trimethoprim 800-160 MG tablet Commonly known as: BACTRIM DS Take 1 tablet by mouth 2 (two) times daily.        Followup:   6 weeks with Dr. Jarvis Mesa for device activation

## 2024-01-12 DIAGNOSIS — N3946 Mixed incontinence: Secondary | ICD-10-CM | POA: Diagnosis not present

## 2024-01-20 ENCOUNTER — Other Ambulatory Visit: Payer: Self-pay | Admitting: Unknown Physician Specialty

## 2024-01-20 DIAGNOSIS — E041 Nontoxic single thyroid nodule: Secondary | ICD-10-CM

## 2024-01-25 ENCOUNTER — Ambulatory Visit
Admission: RE | Admit: 2024-01-25 | Discharge: 2024-01-25 | Disposition: A | Discharge status: Home / Self Care | Source: Ambulatory Visit | Attending: Unknown Physician Specialty | Admitting: Unknown Physician Specialty

## 2024-01-25 DIAGNOSIS — E041 Nontoxic single thyroid nodule: Secondary | ICD-10-CM | POA: Diagnosis not present

## 2024-01-31 ENCOUNTER — Other Ambulatory Visit: Payer: Self-pay | Admitting: Unknown Physician Specialty

## 2024-01-31 DIAGNOSIS — E041 Nontoxic single thyroid nodule: Secondary | ICD-10-CM

## 2024-02-20 DIAGNOSIS — N3946 Mixed incontinence: Secondary | ICD-10-CM | POA: Diagnosis not present

## 2024-02-28 ENCOUNTER — Other Ambulatory Visit
Admission: RE | Admit: 2024-02-28 | Discharge: 2024-02-28 | Disposition: A | Discharge status: Home / Self Care | Attending: Urology | Admitting: Urology

## 2024-02-28 ENCOUNTER — Ambulatory Visit: Payer: Medicare HMO | Admitting: Urology

## 2024-02-28 VITALS — BP 131/87 | HR 99 | Wt 235.0 lb

## 2024-02-28 DIAGNOSIS — C61 Malignant neoplasm of prostate: Secondary | ICD-10-CM | POA: Insufficient documentation

## 2024-02-28 LAB — PSA: Prostatic Specific Antigen: <0.02 ng/mL (ref 0.00–4.00)

## 2024-02-28 NOTE — Progress Notes (Signed)
   02/28/2024 3:08 PM   William Duffy [birth year] [id number]  Reason for visit: Follow up high risk prostate cancer, incontinence  HPI: 68 year old male who presented with PSA 48 and diagnosed with high risk prostate cancer Gleason score 5+4 = 9, with PSMA PET scan showing no definite evidence of metastatic disease but suspected SV involvement.Excellent health he opted for radical prostatectomy with understanding of high risk for extraprostatic extension and positive margins requiring additional radiation.He underwent robotic radical prostatectomy and bilateral pelvic lymph node dissection on 09/14/2021.  Pathology showed 5+4=9 grade group 5 disease with extension through the seminal vesicles posteriorly.  Pelvic lymph nodes were negative for metastatic disease bilaterally.  Unfortunately he did not follow-up until July 2023 due to PSA remains significantly elevated at 24 indicating residual disease, PSMA PET scan showed significant activity superior to the prostatectomy bed consistent with extraprostatic extension identified at time of surgery and a single radiotracer avid right external iliac lymph node suspicious for metastatic disease.  He was treated with 2 years of ADT from July 2023 through July 2025, and adjuvant radiation completed October 2023.  PSA has remained undetectable since that time, PSA today is pending.  He was having fairly significant stress incontinence postoperatively requiring 1 depends and 3 pads daily, ultimately underwent placement of an artificial urinary sphincter in May 2025 with Dr. Lovie.  I reviewed his operative note.  He is using 1 thin pad a day primarily for safety, he reports his leakage is 97% improved, very happy with his result.  He had severe ED prior surgery, not interested in further ED treatments at this time  Follow-up PSA today, contact with results, continue close monitoring every 78mo for the next year, then space to 6 months   William JAYSON Burnet,  MD  [facility] [address], [address] [place], [place] [address] [phone]

## 2024-02-29 ENCOUNTER — Ambulatory Visit: Payer: Self-pay | Admitting: Urology

## 2024-03-16 DIAGNOSIS — E785 Hyperlipidemia, unspecified: Secondary | ICD-10-CM | POA: Diagnosis not present

## 2024-03-16 DIAGNOSIS — E7800 Pure hypercholesterolemia, unspecified: Secondary | ICD-10-CM | POA: Diagnosis not present

## 2024-03-16 DIAGNOSIS — R000 Tachycardia, unspecified: Secondary | ICD-10-CM | POA: Diagnosis not present

## 2024-03-16 DIAGNOSIS — I479 Paroxysmal tachycardia, unspecified: Secondary | ICD-10-CM | POA: Diagnosis not present

## 2024-03-16 DIAGNOSIS — E1169 Type 2 diabetes mellitus with other specified complication: Secondary | ICD-10-CM | POA: Diagnosis not present

## 2024-03-16 DIAGNOSIS — D638 Anemia in other chronic diseases classified elsewhere: Secondary | ICD-10-CM | POA: Diagnosis not present

## 2024-03-16 DIAGNOSIS — D708 Other neutropenia: Secondary | ICD-10-CM | POA: Diagnosis not present

## 2024-03-20 DIAGNOSIS — E119 Type 2 diabetes mellitus without complications: Secondary | ICD-10-CM | POA: Diagnosis not present

## 2024-03-20 DIAGNOSIS — I10 Essential (primary) hypertension: Secondary | ICD-10-CM | POA: Diagnosis not present

## 2024-03-23 DIAGNOSIS — Z1331 Encounter for screening for depression: Secondary | ICD-10-CM | POA: Diagnosis not present

## 2024-03-23 DIAGNOSIS — E785 Hyperlipidemia, unspecified: Secondary | ICD-10-CM | POA: Diagnosis not present

## 2024-03-23 DIAGNOSIS — I10 Essential (primary) hypertension: Secondary | ICD-10-CM | POA: Diagnosis not present

## 2024-03-23 DIAGNOSIS — M25552 Pain in left hip: Secondary | ICD-10-CM | POA: Diagnosis not present

## 2024-03-23 DIAGNOSIS — E119 Type 2 diabetes mellitus without complications: Secondary | ICD-10-CM | POA: Diagnosis not present

## 2024-03-23 DIAGNOSIS — Z0000 Encounter for general adult medical examination without abnormal findings: Secondary | ICD-10-CM | POA: Diagnosis not present

## 2024-03-23 DIAGNOSIS — D649 Anemia, unspecified: Secondary | ICD-10-CM | POA: Diagnosis not present

## 2024-03-28 DIAGNOSIS — R000 Tachycardia, unspecified: Secondary | ICD-10-CM | POA: Diagnosis not present

## 2024-05-08 DIAGNOSIS — M25552 Pain in left hip: Secondary | ICD-10-CM | POA: Diagnosis not present

## 2024-05-08 DIAGNOSIS — Z8739 Personal history of other diseases of the musculoskeletal system and connective tissue: Secondary | ICD-10-CM | POA: Diagnosis not present

## 2024-05-08 DIAGNOSIS — G8929 Other chronic pain: Secondary | ICD-10-CM | POA: Diagnosis not present

## 2024-05-23 DIAGNOSIS — M1612 Unilateral primary osteoarthritis, left hip: Secondary | ICD-10-CM | POA: Diagnosis not present

## 2024-05-23 DIAGNOSIS — Z6838 Body mass index (BMI) 38.0-38.9, adult: Secondary | ICD-10-CM | POA: Diagnosis not present

## 2024-05-23 DIAGNOSIS — E1169 Type 2 diabetes mellitus with other specified complication: Secondary | ICD-10-CM | POA: Diagnosis not present

## 2024-05-23 DIAGNOSIS — E785 Hyperlipidemia, unspecified: Secondary | ICD-10-CM | POA: Diagnosis not present

## 2024-05-28 DIAGNOSIS — M1612 Unilateral primary osteoarthritis, left hip: Secondary | ICD-10-CM | POA: Diagnosis not present

## 2024-05-28 DIAGNOSIS — H2589 Other age-related cataract: Secondary | ICD-10-CM | POA: Diagnosis not present

## 2024-05-28 DIAGNOSIS — Z961 Presence of intraocular lens: Secondary | ICD-10-CM | POA: Diagnosis not present

## 2024-06-21 ENCOUNTER — Other Ambulatory Visit

## 2024-06-21 DIAGNOSIS — C61 Malignant neoplasm of prostate: Secondary | ICD-10-CM

## 2024-06-22 LAB — PSA: Prostate Specific Ag, Serum: <0.1 ng/mL (ref 0.0–4.0)

## 2024-06-25 ENCOUNTER — Encounter: Payer: Self-pay | Admitting: Ophthalmology

## 2024-06-25 ENCOUNTER — Ambulatory Visit: Payer: Self-pay | Admitting: Urology

## 2024-06-25 DIAGNOSIS — C61 Malignant neoplasm of prostate: Secondary | ICD-10-CM

## 2024-06-25 NOTE — Anesthesia Preprocedure Evaluation (Addendum)
"                                    Anesthesia Evaluation  Patient identified by MRN, date of birth, ID band Patient awake    Reviewed: Allergy & Precautions, H&P , NPO status , Patient's Chart, lab work & pertinent test results  Airway Mallampati: III  TM Distance: >3 FB Neck ROM: Full    Dental no notable dental hx. (+) Partial Upper   Pulmonary neg pulmonary ROS, shortness of breath   Pulmonary exam normal breath sounds clear to auscultation       Cardiovascular hypertension, Normal cardiovascular exam Rhythm:Regular Rate:Normal     Neuro/Psych  PSYCHIATRIC DISORDERS      negative neurological ROS  negative psych ROS   GI/Hepatic negative GI ROS, Neg liver ROS,,,(+) Hepatitis -  Endo/Other  diabetes    Renal/GU negative Renal ROS  negative genitourinary   Musculoskeletal negative musculoskeletal ROS (+) Arthritis ,    Abdominal   Peds negative pediatric ROS (+)  Hematology negative hematology ROS (+) Blood dyscrasia, anemia   Anesthesia Other Findings H/O drug abuse (HCC)  Alcohol abuse Hypertension  Liver disease Diverticulosis  H/O: gout Hypercholesteremia  Neutropenia Obesity  Acute lateral meniscus tear of right knee Internal hemorrhoids  Colon adenomas Hepatitis  Diet-controlled diabetes mellitus (HCC) Prostate cancer (HCC)  Dyspnea Arthritis  S/P insertion of penile implant Status post implantation of artificial urinary sphincter Anemia of chronic disease    Reproductive/Obstetrics negative OB ROS                              Anesthesia Physical Anesthesia Plan  ASA: 3  Anesthesia Plan: MAC   Post-op Pain Management:    Induction: Intravenous  PONV Risk Score and Plan:   Airway Management Planned: Natural Airway and Nasal Cannula  Additional Equipment:   Intra-op Plan:   Post-operative Plan:   Informed Consent: I have reviewed the patients History and Physical, chart, labs and  discussed the procedure including the risks, benefits and alternatives for the proposed anesthesia with the patient or authorized representative who has indicated his/her understanding and acceptance.     Dental Advisory Given  Plan Discussed with: Anesthesiologist, CRNA and Surgeon  Anesthesia Plan Comments: (Patient consented for risks of anesthesia including but not limited to:  - adverse reactions to medications - damage to eyes, teeth, lips or other oral mucosa - nerve damage due to positioning  - sore throat or hoarseness - Damage to heart, brain, nerves, lungs, other parts of body or loss of life  Patient voiced understanding and assent.)         Anesthesia Quick Evaluation  "

## 2024-06-26 NOTE — Telephone Encounter (Signed)
 PSA ordered. Lab visit scheduled.

## 2024-07-02 NOTE — Discharge Instructions (Signed)

## 2024-07-03 ENCOUNTER — Encounter: Payer: Self-pay | Admitting: Ophthalmology

## 2024-07-03 ENCOUNTER — Ambulatory Visit: Payer: Self-pay | Admitting: Anesthesiology

## 2024-07-03 ENCOUNTER — Other Ambulatory Visit: Payer: Self-pay

## 2024-07-03 ENCOUNTER — Encounter
Admission: RE | Disposition: A | Discharge status: Home / Self Care | Payer: Self-pay | Source: Home / Self Care | Attending: Ophthalmology

## 2024-07-03 ENCOUNTER — Ambulatory Visit
Admission: RE | Admit: 2024-07-03 | Discharge: 2024-07-03 | Disposition: A | Discharge status: Home / Self Care | Attending: Ophthalmology | Admitting: Ophthalmology

## 2024-07-03 DIAGNOSIS — Z8619 Personal history of other infectious and parasitic diseases: Secondary | ICD-10-CM | POA: Insufficient documentation

## 2024-07-03 DIAGNOSIS — M1990 Unspecified osteoarthritis, unspecified site: Secondary | ICD-10-CM | POA: Diagnosis not present

## 2024-07-03 DIAGNOSIS — H2589 Other age-related cataract: Secondary | ICD-10-CM | POA: Diagnosis present

## 2024-07-03 DIAGNOSIS — I10 Essential (primary) hypertension: Secondary | ICD-10-CM | POA: Diagnosis not present

## 2024-07-03 DIAGNOSIS — E1136 Type 2 diabetes mellitus with diabetic cataract: Secondary | ICD-10-CM | POA: Diagnosis not present

## 2024-07-03 DIAGNOSIS — H2511 Age-related nuclear cataract, right eye: Secondary | ICD-10-CM | POA: Insufficient documentation

## 2024-07-03 HISTORY — DX: Presence of urogenital implants: Z96.0

## 2024-07-03 HISTORY — PX: CATARACT EXTRACTION W/PHACO: SHX586

## 2024-07-03 HISTORY — DX: Anemia in other chronic diseases classified elsewhere: D63.8

## 2024-07-03 LAB — GLUCOSE, CAPILLARY: Glucose-Capillary: 110 mg/dL — ABNORMAL HIGH (ref 70–99)

## 2024-07-03 SURGERY — PHACOEMULSIFICATION, CATARACT, WITH IOL INSERTION
Anesthesia: Monitor Anesthesia Care | Site: Eye | Laterality: Right

## 2024-07-03 MED ORDER — TETRACAINE HCL 0.5 % OP SOLN
1.0000 [drp] | OPHTHALMIC | Status: DC | PRN
  Administered 2024-07-03 (×3): 1 [drp] via OPHTHALMIC

## 2024-07-03 MED ORDER — LACTATED RINGERS IV SOLN: INTRAVENOUS | Status: DC

## 2024-07-03 MED ORDER — TRYPAN BLUE 0.06 % IO SOSY
PREFILLED_SYRINGE | INTRAOCULAR | Status: DC | PRN
  Administered 2024-07-03: .5 mL via INTRAOCULAR

## 2024-07-03 MED ORDER — SIGHTPATH DOSE#1 BSS IO SOLN
INTRAOCULAR | Status: DC | PRN
  Administered 2024-07-03: 101 mL via OPHTHALMIC

## 2024-07-03 MED ORDER — PHENYLEPHRINE HCL 10 % OP SOLN
1.0000 [drp] | OPHTHALMIC | Status: AC | PRN
  Administered 2024-07-03 (×3): 1 [drp] via OPHTHALMIC

## 2024-07-03 MED ORDER — LIDOCAINE HCL (PF) 2 % IJ SOLN
INTRAOCULAR | Status: DC | PRN
  Administered 2024-07-03: 2 mL

## 2024-07-03 MED ORDER — CYCLOPENTOLATE HCL 2 % OP SOLN
1.0000 [drp] | OPHTHALMIC | Status: AC | PRN
  Administered 2024-07-03 (×3): 1 [drp] via OPHTHALMIC

## 2024-07-03 MED ORDER — PHENYLEPHRINE HCL 10 % OP SOLN
OPHTHALMIC | Status: AC
  Filled 2024-07-03: qty 5

## 2024-07-03 MED ORDER — MOXIFLOXACIN HCL 0.5 % OP SOLN
OPHTHALMIC | Status: DC | PRN
  Administered 2024-07-03: .2 mL via OPHTHALMIC

## 2024-07-03 MED ORDER — TETRACAINE HCL 0.5 % OP SOLN
OPHTHALMIC | Status: AC
  Filled 2024-07-03: qty 4

## 2024-07-03 MED ORDER — SIGHTPATH DOSE#1 NA CHONDROIT SULF-NA HYALURON 40-17 MG/ML IO SOLN
INTRAOCULAR | Status: DC | PRN
  Administered 2024-07-03: 1 mL via INTRAOCULAR

## 2024-07-03 MED ORDER — MIDAZOLAM HCL (PF) 2 MG/2ML IJ SOLN
INTRAMUSCULAR | Status: DC | PRN
  Administered 2024-07-03: 2 mg via INTRAVENOUS

## 2024-07-03 MED ORDER — CYCLOPENTOLATE HCL 2 % OP SOLN
OPHTHALMIC | Status: AC
  Filled 2024-07-03: qty 2

## 2024-07-03 MED ORDER — MIDAZOLAM HCL 2 MG/2ML IJ SOLN
INTRAMUSCULAR | Status: AC
  Filled 2024-07-03: qty 2

## 2024-07-03 MED ORDER — SIGHTPATH DOSE#1 BSS IO SOLN
INTRAOCULAR | Status: DC | PRN
  Administered 2024-07-03: 15 mL via INTRAOCULAR

## 2024-07-03 MED ORDER — FENTANYL CITRATE (PF) 100 MCG/2ML IJ SOLN
INTRAMUSCULAR | Status: DC | PRN
  Administered 2024-07-03 (×2): 50 ug via INTRAVENOUS

## 2024-07-03 MED ORDER — FENTANYL CITRATE (PF) 100 MCG/2ML IJ SOLN
INTRAMUSCULAR | Status: AC
  Filled 2024-07-03: qty 2

## 2024-07-03 MED ORDER — BRIMONIDINE TARTRATE-TIMOLOL 0.2-0.5 % OP SOLN
OPHTHALMIC | Status: DC | PRN
  Administered 2024-07-03: 1 [drp] via OPHTHALMIC

## 2024-07-03 SURGICAL SUPPLY — 9 items
CANNULA ANT/CHMB 27G (MISCELLANEOUS) ×1 IMPLANT
CYSTOTOME ANGL RVRS SHRT 25G (CUTTER) ×1 IMPLANT
FEE CATARACT SUITE SIGHTPATH (MISCELLANEOUS) ×1 IMPLANT
GLOVE BIOGEL PI IND STRL 8 (GLOVE) ×1 IMPLANT
GLOVE SURG LX STRL 8.0 MICRO (GLOVE) ×1 IMPLANT
GLOVE SURG SYN 6.5 PF PI BL (GLOVE) ×1 IMPLANT
LENS IOL TECNIS EYHANCE 18.0 (Intraocular Lens) IMPLANT
NEEDLE FILTER BLUNT 18X1 1/2 (NEEDLE) ×1 IMPLANT
SYR 3ML LL SCALE MARK (SYRINGE) ×1 IMPLANT

## 2024-07-03 NOTE — Transfer of Care (Signed)
 Immediate Anesthesia Transfer of Care Note  Patient: Severiano Utsey  Procedure(s) Performed: PHACOEMULSIFICATION, CATARACT, WITH IOL INSERTION 8.02 01:08.2 (Right: Eye)  Patient Location: PACU  Anesthesia Type: MAC  Level of Consciousness: awake, alert  and patient cooperative  Airway and Oxygen Therapy: Patient Spontanous Breathing   Post-op Assessment: Post-op Vital signs reviewed, Patient's Cardiovascular Status Stable, Respiratory Function Stable, Patent Airway and No signs of Nausea or vomiting  Post-op Vital Signs: Reviewed and stable  Complications: No notable events documented.

## 2024-07-03 NOTE — H&P (Signed)
 [facility]   Primary Care Physician:  Alla Amis, MD Ophthalmologist: Dr. Elsie Carmine  Pre-Procedure History & Physical: HPI:  William Duffy is a 68 y.o. male here for cataract surgery.   Past Medical History:  Diagnosis Date   Acute lateral meniscus tear of right knee    Alcohol abuse    Anemia of chronic disease    Arthritis    Colon adenomas    Diet-controlled diabetes mellitus (HCC)    type 2   Diverticulosis    Dyspnea    H/O drug abuse (HCC)    H/O: gout    Hepatitis    hepatitis c   Hypercholesteremia    Hypertension    Internal hemorrhoids    Liver disease    Neutropenia    Obesity    Prostate cancer (HCC)    S/P insertion of penile implant    Status post implantation of artificial urinary sphincter     Past Surgical History:  Procedure Laterality Date   CATARACT EXTRACTION W/PHACO Left 03/29/2019   Procedure: CATARACT EXTRACTION PHACO AND INTRAOCULAR LENS PLACEMENT (IOC)  LEFT;  Surgeon: Ferol Rogue, MD;  Location: [facility] ORS;  Service: Ophthalmology;  Laterality: Left;  US  01:35.7 AP% 14.6 CDE 16.14 Fluid Pack Lot #[id number]   COLONOSCOPY WITH PROPOFOL  N/A 01/03/2015   Procedure: COLONOSCOPY WITH PROPOFOL ;  Surgeon: Gladis RAYMOND Mariner, MD;  Location: [facility] END[facility];  Service: Endoscopy;  Laterality: N/A;   COLONOSCOPY WITH PROPOFOL  N/A 05/06/2015   Procedure: COLONOSCOPY WITH PROPOFOL ;  Surgeon: Gladis RAYMOND Mariner, MD;  Location: [facility] ENDOSCOPY;  Service: Endoscopy;  Laterality: N/A;   COLONOSCOPY WITH PROPOFOL  N/A 06/06/2017   Procedure: COLONOSCOPY WITH PROPOFOL ;  Surgeon: Mariner Gladis RAYMOND, MD;  Location: [facility] ENDOSCOPY;  Service: Endoscopy;  Laterality: N/A;   COLONOSCOPY WITH PROPOFOL  N/A 03/18/2023   Procedure: COLONOSCOPY WITH PROPOFOL ;  Surgeon: Onita Elspeth Sharper, DO;  Location: [facility] ENDOSCOPY;  Service: Gastroenterology;  Laterality: N/A;   HEMOSTASIS CLIP PLACEMENT  03/18/2023   Procedure: HEMOSTASIS CLIP PLACEMENT;  Surgeon:  Onita Elspeth Sharper, DO;  Location: [facility] ENDOSCOPY;  Service: Gastroenterology;;   KNEE ARTHROSCOPY Right    LIVER BIOPSY     MENISCUS REPAIR     PELVIC LYMPH NODE DISSECTION Bilateral 09/14/2021   Procedure: PELVIC LYMPH NODE DISSECTION;  Surgeon: Francisca Rogue BROCKS, MD;  Location: [facility] ORS;  Service: Urology;  Laterality: Bilateral;   POLYPECTOMY  03/18/2023   Procedure: POLYPECTOMY;  Surgeon: Onita Elspeth Sharper, DO;  Location: [facility] ENDOSCOPY;  Service: Gastroenterology;;   PROSTATE BIOPSY N/A 07/21/2021   Procedure: PROSTATE BIOPSY;  Surgeon: Twylla Glendia BROCKS, MD;  Location: [facility] ORS;  Service: Urology;  Laterality: N/A;   ROBOT ASSISTED LAPAROSCOPIC RADICAL PROSTATECTOMY N/A 09/14/2021   Procedure: XI ROBOTIC ASSISTED LAPAROSCOPIC RADICAL PROSTATECTOMY;  Surgeon: Francisca Rogue BROCKS, MD;  Location: [facility] ORS;  Service: Urology;  Laterality: N/A;   TRANSRECTAL ULTRASOUND N/A 07/21/2021   Procedure: TRANSRECTAL ULTRASOUND;  Surgeon: Twylla Glendia BROCKS, MD;  Location: [facility] ORS;  Service: Urology;  Laterality: N/A;   URINARY SPHINCTER IMPLANT N/A 11/29/2023   Procedure: INSERTION, ARTIFICIAL URINARY SPHINCTER;  Surgeon: Lovie Arlyss CROME, MD;  Location: [facility] ORS;  Service: Urology;  Laterality: N/A;    Prior to Admission medications  Medication Sig Start Date End Date Taking? Authorizing Provider  chlorthalidone  (HYGROTON ) 25 MG tablet Take 25 mg by mouth daily.   Yes [provider]  indomethacin  (INDOCIN ) 50 MG capsule Take 50 mg by mouth 2 (two) times daily  as needed (gout flare).    Yes [provider]  losartan  (COZAAR ) 100 MG tablet Take 100 mg by mouth daily. 07/27/22  Yes [provider]  lovastatin (MEVACOR) 40 MG tablet Take 40 mg by mouth at bedtime. 07/17/21  Yes [provider]  metFORMIN (GLUCOPHAGE-XR) 500 MG 24 hr tablet Take 500 mg by mouth daily. 06/16/23  Yes [provider]  Multiple Vitamins-Minerals (EMERGEN-C ENERGY PLUS PO) Take 2  tablets by mouth daily.   Yes [provider]  acetaminophen  (TYLENOL ) 500 MG tablet Take 2 tablets (1,000 mg total) by mouth every 6 (six) hours. Patient not taking: Reported on 02/28/2024 11/29/23   Lovie Arlyss CROME, MD    Allergies as of 06/05/2024 - Review Complete 02/28/2024  Allergen Reaction Noted   Shrimp [shellfish allergy] Hives and Swelling 01/02/2015    History reviewed. No pertinent family history.  Social History   Socioeconomic History   Marital status: Widowed    Spouse name: Not on file   Number of children: Not on file   Years of education: Not on file   Highest education level: Not on file  Occupational History   Not on file  Tobacco Use   Smoking status: Never    Passive exposure: Never   Smokeless tobacco: Never  Vaping Use   Vaping status: Never Used  Substance and Sexual Activity   Alcohol use: Not Currently    Comment: none since 1997   Drug use: Not Currently    Types: Cocaine, Marijuana    Comment: none since 1997   Sexual activity: Yes    Birth control/protection: None  Other Topics Concern   Not on file  Social History Narrative   Not on file   Social Drivers of Health   Tobacco Use: Low Risk (07/03/2024)   Patient History    Smoking Tobacco Use: Never    Smokeless Tobacco Use: Never    Passive Exposure: Never  Financial Resource Strain: Low Risk  (05/23/2024)   Received from [facility]   Overall Financial Resource Strain (CARDIA)    Difficulty of Paying Living Expenses: Not hard at all  Food Insecurity: No Food Insecurity (05/23/2024)   Received from [facility]   Epic    Within the past 12 months, you worried that your food would run out before you got the money to buy more.: Never true    Within the past 12 months, the food you bought just didn't last and you didn't have money to get more.: Never true  Transportation Needs: No Transportation Needs (05/23/2024)   Received from [facility] - Transportation    In the past 12 months, has lack of transportation kept you from medical appointments or from getting medications?: No    Lack of Transportation (Non-Medical): No  Physical Activity: Sufficiently Active (03/26/2022)   Exercise Vital Sign    Days of Exercise per Week: 6 days    Minutes of Exercise per Session: 70 min  Stress: No Stress Concern Present (03/26/2022)   [employer] - Occupational Stress Questionnaire    Feeling of Stress : Not at all  Social Connections: Moderately Integrated (11/29/2023)   Social Connection and Isolation Panel    Frequency of Communication with Friends and Family: More than three times a week    Frequency of Social Gatherings with Friends and Family: More than three times a week    Attends Religious  Services: More than 4 times per year    Active Member of Clubs or Organizations: Yes    Attends Banker Meetings: More than 4 times per year    Marital Status: Widowed  Intimate Partner Violence: Not At Risk (11/29/2023)   Humiliation, Afraid, Rape, and Kick questionnaire    Fear of Current or Ex-Partner: No    Emotionally Abused: No    Physically Abused: No    Sexually Abused: No  Depression (PHQ2-9): Low Risk (03/26/2022)   Depression (PHQ2-9)    PHQ-2 Score: 0  Alcohol Screen: Low Risk (03/26/2022)   Alcohol Screen    Last Alcohol Screening Score (AUDIT): 0  Housing: Low Risk  (05/23/2024)   Received from [facility]   Epic    In the last 12 months, was there a time when you were not able to pay the mortgage or rent on time?: No    In the past 12 months, how many times have you moved where you were living?: 0    At any time in the past 12 months, were you homeless or living in a shelter (including now)?: No  Utilities: Not At Risk (05/23/2024)   Received from [facility]   Epic    In the past 12 months has the electric, gas,  oil, or water  company threatened to shut off services in your home?: No  Health Literacy: Not on file    Review of Systems: See HPI, otherwise negative ROS  Physical Exam: BP (!) 146/87   Temp 98 F (36.7 C) (Temporal)   Resp 13   Ht 5' 5.98 (1.676 m)   Wt 106.4 kg   SpO2 98%   BMI 37.88 kg/m  General:   Alert, cooperative. Head:  Normocephalic and atraumatic. Respiratory:  Normal work of breathing. Cardiovascular:  NAD  Impression/Plan: Brittain Hosie is here for cataract surgery.  Risks, benefits, limitations, and alternatives regarding cataract surgery have been reviewed with the patient.  Questions have been answered.  All parties agreeable.   Elsie Carmine, MD  07/03/2024, 7:18 AM

## 2024-07-03 NOTE — Anesthesia Postprocedure Evaluation (Signed)
"   Anesthesia Post Note  Patient: William Duffy  Procedure(s) Performed: PHACOEMULSIFICATION, CATARACT, WITH IOL INSERTION 8.02 01:08.2 (Right: Eye)  Patient location during evaluation: PACU Anesthesia Type: MAC Level of consciousness: awake and alert Pain management: pain level controlled Vital Signs Assessment: post-procedure vital signs reviewed and stable Respiratory status: spontaneous breathing, nonlabored ventilation, respiratory function stable and patient connected to nasal cannula oxygen Cardiovascular status: stable and blood pressure returned to baseline Postop Assessment: no apparent nausea or vomiting Anesthetic complications: no   No notable events documented.   Last Vitals:  Vitals:   07/03/24 0800 07/03/24 0804  BP:  128/81  Pulse: 79 74  Resp: 11 10  Temp:  (!) 36.2 C  SpO2: 98% 96%    Last Pain:  Vitals:   07/03/24 0804  TempSrc:   PainSc: 0-No pain                 Suellen Durocher C Jumar Greenstreet      "

## 2024-07-03 NOTE — Op Note (Signed)
 PREOPERATIVE DIAGNOSIS:  Nuclear sclerotic cataract of the right eye.   POSTOPERATIVE DIAGNOSIS:  Right Eye Cataract   OPERATIVE PROCEDURE:ORPROCALL@   SURGEON:  Elsie Carmine, MD.   ANESTHESIA:  Anesthesiologist: Ola Donny BROCKS, MD CRNA: Veronica Alm BROCKS, CRNA  1.      Managed anesthesia care. 2.      0.10ml of Shugarcaine was instilled in the eye following the paracentesis.   COMPLICATIONS: Vision Blue was used to stain the anterior capsule due to very poor/ no visualization of the red reflex.    TECHNIQUE:   Stop and chop   DESCRIPTION OF PROCEDURE:  The patient was examined and consented in the preoperative holding area where the aforementioned topical anesthesia was applied to the right eye and then brought back to the Operating Room where the right eye was prepped and draped in the usual sterile ophthalmic fashion and a lid speculum was placed. A paracentesis was created with the side port blade and the anterior chamber was filled with viscoelastic. A near clear corneal incision was performed with the steel keratome. A continuous curvilinear capsulorrhexis was performed with a cystotome followed by the capsulorrhexis forceps. Hydrodissection and hydrodelineation were carried out with BSS on a blunt cannula. The lens was removed in a stop and chop  technique and the remaining cortical material was removed with the irrigation-aspiration handpiece. The capsular bag was inflated with viscoelastic and the intraocular lens was placed in the capsular bag without complication. The remaining viscoelastic was removed from the eye with the irrigation-aspiration handpiece. The wounds were hydrated. The anterior chamber was flushed with BSS and the eye was inflated to physiologic pressure. 0.28ml of Vigamox  was placed in the anterior chamber. The wounds were found to be water  tight. The eye was dressed with Combigan . The patient was given protective glasses to wear throughout the day and a shield with  which to sleep tonight. The patient was also given drops with which to begin a drop regimen today and will follow-up with me in one day. Implant Name Type Inv. Item Serial No. Manufacturer Lot No. LRB No. Used Action  LENS IOL TECNIS EYHANCE 18.0 - [id number] Intraocular Lens LENS IOL TECNIS EYHANCE 18.0 [id number] [employer]  Right 1 Implanted   Procedures: PHACOEMULSIFICATION, CATARACT, WITH IOL INSERTION 8.02 01:08.2 (Right)  Electronically signed: Elsie Carmine 07/03/2024 7:57 AM

## 2024-10-25 ENCOUNTER — Other Ambulatory Visit
